# Patient Record
Sex: Female | Born: 1950
Health system: Southern US, Community
[De-identification: ages and names within clinical notes are randomized; demographics above are authoritative.]

## PROBLEM LIST (undated history)

## (undated) DIAGNOSIS — M654 Radial styloid tenosynovitis [de Quervain]: Secondary | ICD-10-CM

## (undated) DIAGNOSIS — E559 Vitamin D deficiency, unspecified: Secondary | ICD-10-CM

## (undated) DIAGNOSIS — H6120 Impacted cerumen, unspecified ear: Secondary | ICD-10-CM

## (undated) DIAGNOSIS — F32A Depression, unspecified: Secondary | ICD-10-CM

## (undated) DIAGNOSIS — E785 Hyperlipidemia, unspecified: Secondary | ICD-10-CM

## (undated) DIAGNOSIS — Z6829 Body mass index (BMI) 29.0-29.9, adult: Secondary | ICD-10-CM

## (undated) DIAGNOSIS — R198 Other specified symptoms and signs involving the digestive system and abdomen: Secondary | ICD-10-CM

## (undated) DIAGNOSIS — J309 Allergic rhinitis, unspecified: Secondary | ICD-10-CM

## (undated) DIAGNOSIS — Z9181 History of falling: Secondary | ICD-10-CM

## (undated) DIAGNOSIS — F419 Anxiety disorder, unspecified: Secondary | ICD-10-CM

## (undated) DIAGNOSIS — L989 Disorder of the skin and subcutaneous tissue, unspecified: Secondary | ICD-10-CM

## (undated) DIAGNOSIS — M8588 Other specified disorders of bone density and structure, other site: Secondary | ICD-10-CM

## (undated) DIAGNOSIS — I10 Essential (primary) hypertension: Secondary | ICD-10-CM

## (undated) DIAGNOSIS — R197 Diarrhea, unspecified: Secondary | ICD-10-CM

## (undated) DIAGNOSIS — N390 Urinary tract infection, site not specified: Secondary | ICD-10-CM

## (undated) DIAGNOSIS — M19019 Primary osteoarthritis, unspecified shoulder: Secondary | ICD-10-CM

## (undated) DIAGNOSIS — H68003 Unspecified Eustachian salpingitis, bilateral: Secondary | ICD-10-CM

## (undated) DIAGNOSIS — E7800 Pure hypercholesterolemia, unspecified: Secondary | ICD-10-CM

## (undated) DIAGNOSIS — R300 Dysuria: Secondary | ICD-10-CM

## (undated) DIAGNOSIS — N3281 Overactive bladder: Secondary | ICD-10-CM

## (undated) DIAGNOSIS — R739 Hyperglycemia, unspecified: Secondary | ICD-10-CM

## (undated) DIAGNOSIS — E119 Type 2 diabetes mellitus without complications: Secondary | ICD-10-CM

## (undated) DIAGNOSIS — F329 Major depressive disorder, single episode, unspecified: Secondary | ICD-10-CM

## (undated) DIAGNOSIS — J45909 Unspecified asthma, uncomplicated: Secondary | ICD-10-CM

## (undated) DIAGNOSIS — E663 Overweight: Secondary | ICD-10-CM

## (undated) DIAGNOSIS — Z780 Asymptomatic menopausal state: Secondary | ICD-10-CM

## (undated) DIAGNOSIS — Z87898 Personal history of other specified conditions: Secondary | ICD-10-CM

## (undated) DIAGNOSIS — H6123 Impacted cerumen, bilateral: Secondary | ICD-10-CM

## (undated) DIAGNOSIS — J40 Bronchitis, not specified as acute or chronic: Secondary | ICD-10-CM

## (undated) DIAGNOSIS — F410 Panic disorder [episodic paroxysmal anxiety] without agoraphobia: Secondary | ICD-10-CM

## (undated) DIAGNOSIS — E669 Obesity, unspecified: Secondary | ICD-10-CM

## (undated) DIAGNOSIS — F4321 Adjustment disorder with depressed mood: Secondary | ICD-10-CM

## (undated) DIAGNOSIS — F39 Unspecified mood [affective] disorder: Secondary | ICD-10-CM

## (undated) HISTORY — DX: Bronchitis, not specified as acute or chronic: J40

## (undated) HISTORY — DX: Impacted cerumen, unspecified ear: H61.20

## (undated) HISTORY — DX: Vitamin D deficiency, unspecified: E55.9

## (undated) HISTORY — DX: Body mass index (BMI) 29.0-29.9, adult: Z68.29

## (undated) HISTORY — DX: Overweight: E66.3

## (undated) HISTORY — DX: Hyperlipidemia, unspecified: E78.5

## (undated) HISTORY — DX: Adjustment disorder with depressed mood: F43.21

## (undated) HISTORY — DX: Personal history of other specified conditions: Z87.898

## (undated) HISTORY — DX: Primary osteoarthritis, unspecified shoulder: M19.019

## (undated) HISTORY — DX: Overactive bladder: N32.81

## (undated) HISTORY — DX: Allergic rhinitis, unspecified: J30.9

## (undated) HISTORY — DX: Dysuria: R30.0

## (undated) HISTORY — DX: History of falling: Z91.81

## (undated) HISTORY — DX: Unspecified eustachian salpingitis, bilateral: H68.003

## (undated) HISTORY — DX: Anxiety disorder, unspecified: F41.9

## (undated) HISTORY — DX: Other specified symptoms and signs involving the digestive system and abdomen: R19.8

## (undated) HISTORY — DX: Depression, unspecified: F32.A

## (undated) HISTORY — DX: Disorder of the skin and subcutaneous tissue, unspecified: L98.9

## (undated) HISTORY — DX: Radial styloid tenosynovitis (de quervain): M65.4

## (undated) HISTORY — DX: Urinary tract infection, site not specified: N39.0

## (undated) HISTORY — DX: Other specified disorders of bone density and structure, other site: M85.88

## (undated) HISTORY — DX: Unspecified asthma, uncomplicated: J45.909

## (undated) HISTORY — DX: Obesity, unspecified: E66.9

## (undated) HISTORY — DX: Type 2 diabetes mellitus without complications: E11.9

## (undated) HISTORY — DX: Asymptomatic menopausal state: Z78.0

## (undated) HISTORY — DX: Impacted cerumen, bilateral: H61.23

## (undated) HISTORY — DX: Unspecified mood (affective) disorder: F39

## (undated) HISTORY — DX: Diarrhea, unspecified: R19.7

## (undated) HISTORY — DX: Hyperglycemia, unspecified: R73.9

---

## 1981-09-26 HISTORY — PX: TUBAL LIGATION: SHX77

## 1997-09-26 HISTORY — PX: TONSILLECTOMY: SUR1361

## 2004-09-26 HISTORY — PX: BREAST BIOPSY: SHX20

## 2014-12-16 ENCOUNTER — Emergency Department (HOSPITAL_BASED_OUTPATIENT_CLINIC_OR_DEPARTMENT_OTHER)
Admission: EM | Admit: 2014-12-16 | Discharge: 2014-12-16 | Disposition: A | Discharge status: Home / Self Care | Payer: No Typology Code available for payment source | Attending: Emergency Medicine | Admitting: Emergency Medicine

## 2014-12-16 ENCOUNTER — Encounter (HOSPITAL_BASED_OUTPATIENT_CLINIC_OR_DEPARTMENT_OTHER): Payer: Self-pay | Admitting: Emergency Medicine

## 2014-12-16 DIAGNOSIS — R197 Diarrhea, unspecified: Secondary | ICD-10-CM | POA: Diagnosis not present

## 2014-12-16 DIAGNOSIS — R1110 Vomiting, unspecified: Secondary | ICD-10-CM

## 2014-12-16 DIAGNOSIS — R109 Unspecified abdominal pain: Secondary | ICD-10-CM | POA: Diagnosis present

## 2014-12-16 DIAGNOSIS — R112 Nausea with vomiting, unspecified: Secondary | ICD-10-CM | POA: Insufficient documentation

## 2014-12-16 DIAGNOSIS — H9209 Otalgia, unspecified ear: Secondary | ICD-10-CM | POA: Insufficient documentation

## 2014-12-16 DIAGNOSIS — I10 Essential (primary) hypertension: Secondary | ICD-10-CM | POA: Diagnosis not present

## 2014-12-16 DIAGNOSIS — R05 Cough: Secondary | ICD-10-CM | POA: Insufficient documentation

## 2014-12-16 DIAGNOSIS — Z880 Allergy status to penicillin: Secondary | ICD-10-CM | POA: Diagnosis not present

## 2014-12-16 DIAGNOSIS — N3090 Cystitis, unspecified without hematuria: Secondary | ICD-10-CM | POA: Diagnosis not present

## 2014-12-16 DIAGNOSIS — R000 Tachycardia, unspecified: Secondary | ICD-10-CM | POA: Insufficient documentation

## 2014-12-16 DIAGNOSIS — R51 Headache: Secondary | ICD-10-CM | POA: Insufficient documentation

## 2014-12-16 DIAGNOSIS — Z79899 Other long term (current) drug therapy: Secondary | ICD-10-CM | POA: Insufficient documentation

## 2014-12-16 DIAGNOSIS — Z8639 Personal history of other endocrine, nutritional and metabolic disease: Secondary | ICD-10-CM | POA: Diagnosis not present

## 2014-12-16 DIAGNOSIS — F419 Anxiety disorder, unspecified: Secondary | ICD-10-CM | POA: Insufficient documentation

## 2014-12-16 HISTORY — DX: Essential (primary) hypertension: I10

## 2014-12-16 HISTORY — DX: Pure hypercholesterolemia, unspecified: E78.00

## 2014-12-16 HISTORY — DX: Panic disorder (episodic paroxysmal anxiety): F41.0

## 2014-12-16 HISTORY — DX: Anxiety disorder, unspecified: F41.9

## 2014-12-16 LAB — URINALYSIS, ROUTINE W REFLEX MICROSCOPIC
Bilirubin Urine: NEGATIVE
Glucose, UA: NEGATIVE mg/dL
Hgb urine dipstick: NEGATIVE
KETONES UR: NEGATIVE mg/dL
Nitrite: NEGATIVE
PH: 6 (ref 5.0–8.0)
Protein, ur: NEGATIVE mg/dL
Specific Gravity, Urine: 1.015 (ref 1.005–1.030)
UROBILINOGEN UA: 0.2 mg/dL (ref 0.0–1.0)

## 2014-12-16 LAB — CBC WITH DIFFERENTIAL/PLATELET
BASOS ABS: 0 10*3/uL (ref 0.0–0.1)
BASOS PCT: 0 % (ref 0–1)
Eosinophils Absolute: 0.1 10*3/uL (ref 0.0–0.7)
Eosinophils Relative: 1 % (ref 0–5)
HEMATOCRIT: 36.8 % (ref 36.0–46.0)
Hemoglobin: 11.9 g/dL — ABNORMAL LOW (ref 12.0–15.0)
LYMPHS ABS: 0.6 10*3/uL — AB (ref 0.7–4.0)
LYMPHS PCT: 6 % — AB (ref 12–46)
MCH: 26.2 pg (ref 26.0–34.0)
MCHC: 32.3 g/dL (ref 30.0–36.0)
MCV: 81.1 fL (ref 78.0–100.0)
MONO ABS: 0.9 10*3/uL (ref 0.1–1.0)
Monocytes Relative: 9 % (ref 3–12)
NEUTROS PCT: 84 % — AB (ref 43–77)
Neutro Abs: 8.3 10*3/uL — ABNORMAL HIGH (ref 1.7–7.7)
Platelets: 249 10*3/uL (ref 150–400)
RBC: 4.54 MIL/uL (ref 3.87–5.11)
RDW: 13.8 % (ref 11.5–15.5)
WBC: 9.9 10*3/uL (ref 4.0–10.5)

## 2014-12-16 LAB — URINE MICROSCOPIC-ADD ON

## 2014-12-16 LAB — COMPREHENSIVE METABOLIC PANEL
ALK PHOS: 42 U/L (ref 39–117)
ALT: 21 U/L (ref 0–35)
AST: 25 U/L (ref 0–37)
Albumin: 4 g/dL (ref 3.5–5.2)
Anion gap: 8 (ref 5–15)
BUN: 11 mg/dL (ref 6–23)
CALCIUM: 9.2 mg/dL (ref 8.4–10.5)
CO2: 24 mmol/L (ref 19–32)
Chloride: 105 mmol/L (ref 96–112)
Creatinine, Ser: 0.9 mg/dL (ref 0.50–1.10)
GFR, EST AFRICAN AMERICAN: 78 mL/min — AB (ref >90)
GFR, EST NON AFRICAN AMERICAN: 67 mL/min — AB (ref >90)
GLUCOSE: 128 mg/dL — AB (ref 70–99)
Potassium: 3.7 mmol/L (ref 3.5–5.1)
Sodium: 137 mmol/L (ref 135–145)
Total Bilirubin: 0.4 mg/dL (ref 0.3–1.2)
Total Protein: 7.7 g/dL (ref 6.0–8.3)

## 2014-12-16 MED ORDER — ACETAMINOPHEN 325 MG PO TABS
650.0000 mg | ORAL_TABLET | Freq: Once | ORAL | Status: AC
  Administered 2014-12-16: 650 mg via ORAL
  Filled 2014-12-16 (×2): qty 2

## 2014-12-16 MED ORDER — ONDANSETRON HCL 4 MG/2ML IJ SOLN
4.0000 mg | Freq: Once | INTRAMUSCULAR | Status: AC
  Administered 2014-12-16: 4 mg via INTRAVENOUS

## 2014-12-16 MED ORDER — IBUPROFEN 400 MG PO TABS
600.0000 mg | ORAL_TABLET | Freq: Once | ORAL | Status: AC
  Administered 2014-12-16: 600 mg via ORAL
  Filled 2014-12-16 (×2): qty 1

## 2014-12-16 MED ORDER — SODIUM CHLORIDE 0.9 % IV BOLUS (SEPSIS)
1000.0000 mL | Freq: Once | INTRAVENOUS | Status: AC
  Administered 2014-12-16: 1000 mL via INTRAVENOUS

## 2014-12-16 MED ORDER — ONDANSETRON HCL 4 MG/2ML IJ SOLN
INTRAMUSCULAR | Status: AC
  Administered 2014-12-16: 4 mg via INTRAVENOUS
  Filled 2014-12-16: qty 2

## 2014-12-16 MED ORDER — CEFPODOXIME PROXETIL 100 MG PO TABS: 100.0000 mg | ORAL_TABLET | Freq: Two times a day (BID) | ORAL | Status: DC

## 2014-12-16 MED ORDER — ONDANSETRON 4 MG PO TBDP: ORAL_TABLET | ORAL | Status: DC

## 2014-12-16 NOTE — ED Notes (Signed)
HA, cough, back pain, body aches, vomiting, diarrhea.  Grandchild diagnosed with flu last week and pt has been taking care of her.

## 2014-12-16 NOTE — Discharge Instructions (Signed)

## 2014-12-16 NOTE — ED Provider Notes (Signed)
CSN: [id number]     Arrival date & time 12/16/14  2021 History  This chart was scribed for Mirian Mo, MD by Elon Spanner, ED Scribe. This patient was seen in room MH05/MH05 and the patient's care was started at 8:57 PM.   Chief Complaint  Patient presents with  . Flu-like symptoms    The history is provided by the patient. No language interpreter was used.   HPI Comments: Heather Woods is a 64 y.o. female who presents to the Emergency Department complaining of vomiting with associated diarrhea, transient abdominal cramping, headache, dry cough, ear pain onset last night.  Patient reports her granddaughter was diagnosed with the flu last week and the patient has been in close contact.  Patient reports a history of HTN, HLD, GAD. No associated measured fevers. Patient denies history of respiratory conditions. Symptoms intermittent, no exacerbating or alleviating factors.   Past Medical History  Diagnosis Date  . High cholesterol   . Hypertension   . Anxiety   . Panic attack    History reviewed. No pertinent past surgical history. No family history on file. History  Substance Use Topics  . Smoking status: Never Smoker   . Smokeless tobacco: Not on file  . Alcohol Use: No   OB History    No data available     Review of Systems  HENT: Positive for ear pain.   Respiratory: Positive for cough.   Gastrointestinal: Positive for nausea, vomiting, abdominal pain and diarrhea.  Neurological: Positive for headaches.  All other systems reviewed and are negative.     Allergies  Penicillins  Home Medications   Prior to Admission medications   Medication Sig Start Date End Date Taking? Authorizing Provider  lisinopril (PRINIVIL,ZESTRIL) 2.5 MG tablet Take 2.5 mg by mouth daily.   Yes Historical Provider, MD  sertraline (ZOLOFT) 50 MG tablet Take 50 mg by mouth daily.   Yes Historical Provider, MD  cefpodoxime (VANTIN) 100 MG tablet Take 1 tablet (100 mg total) by mouth 2 (two)  times daily. 12/16/14   Mirian Mo, MD  ondansetron (ZOFRAN ODT) 4 MG disintegrating tablet  ODT q4 hours prn nausea/vomit 12/16/14   Mirian Mo, MD   BP 126/71 mmHg  Pulse 102  Temp(Src) 100.8 F (38.2 C) (Oral)  Resp 20  Ht  (1.626 m)  Wt 160 lb (72.576 kg)  BMI 27.45 kg/m2  SpO2 93% Physical Exam  Constitutional: She is oriented to person, place, and time. She appears well-developed and well-nourished.  HENT:  Head: Normocephalic and atraumatic.  Right Ear: External ear normal.  Left Ear: External ear normal.  Eyes: Conjunctivae and EOM are normal. Pupils are equal, round, and reactive to light.  Neck: Normal range of motion. Neck supple.  Cardiovascular: Regular rhythm, normal heart sounds and intact distal pulses.  Tachycardia present.   Pulmonary/Chest: Effort normal and breath sounds normal.  Abdominal: Soft. Bowel sounds are normal. There is no tenderness.  Musculoskeletal: Normal range of motion.  Neurological: She is alert and oriented to person, place, and time.  Skin: Skin is warm and dry.  Vitals reviewed.   ED Course  Procedures (including critical care time)  DIAGNOSTIC STUDIES: Oxygen Saturation is 96% on RA, adequate by my interpretation.    COORDINATION OF CARE:  9:02 PM Discussed treatment plan with patient at bedside.  Patient acknowledges and agrees with plan.    Labs Review Labs Reviewed  CBC WITH DIFFERENTIAL/PLATELET - Abnormal; Notable for the following:  Hemoglobin 11.9 (*)    Neutrophils Relative % 84 (*)    Neutro Abs 8.3 (*)    Lymphocytes Relative 6 (*)    Lymphs Abs 0.6 (*)    All other components within normal limits  COMPREHENSIVE METABOLIC PANEL - Abnormal; Notable for the following:    Glucose, Bld 128 (*)    GFR calc non Af Amer 67 (*)    GFR calc Af Amer 78 (*)    All other components within normal limits  URINALYSIS, ROUTINE W REFLEX MICROSCOPIC - Abnormal; Notable for the following:    Leukocytes, UA MODERATE  (*)    All other components within normal limits  URINE MICROSCOPIC-ADD ON    Imaging Review No results found.   EKG Interpretation None      MDM   Final diagnoses:  Vomiting and diarrhea  Cystitis    64 y.o. female with pertinent PMH of HTN, anxiety presents with signs and symptoms as above consistent with gastritis. Patient has known sick contacts. On arrival today vitals signs and physical exam as above. Likely gastroenteritis. Workup unremarkable. Symptoms improved after normal saline bolus and Zofran. Urinalysis with questionable UTI, will treat empirically with Macrobid. Discharged home in stable condition. Standard return precautions given. Offered Tamiflu, however we shared decision-making agreed to defer at this time as the patient has no risk factors for increased mortality..    I have reviewed all laboratory and imaging studies if ordered as above  1. Vomiting and diarrhea   2. Cystitis           Mirian MoMatthew Peggie Hornak, MD 12/16/14 [phone]

## 2015-08-23 ENCOUNTER — Emergency Department (HOSPITAL_BASED_OUTPATIENT_CLINIC_OR_DEPARTMENT_OTHER)
Admission: EM | Admit: 2015-08-23 | Discharge: 2015-08-24 | Disposition: A | Discharge status: Home / Self Care | Payer: 59 | Attending: Emergency Medicine | Admitting: Emergency Medicine

## 2015-08-23 ENCOUNTER — Encounter (HOSPITAL_BASED_OUTPATIENT_CLINIC_OR_DEPARTMENT_OTHER): Payer: Self-pay | Admitting: *Deleted

## 2015-08-23 DIAGNOSIS — I10 Essential (primary) hypertension: Secondary | ICD-10-CM | POA: Diagnosis not present

## 2015-08-23 DIAGNOSIS — Z79899 Other long term (current) drug therapy: Secondary | ICD-10-CM | POA: Diagnosis not present

## 2015-08-23 DIAGNOSIS — Z880 Allergy status to penicillin: Secondary | ICD-10-CM | POA: Diagnosis not present

## 2015-08-23 DIAGNOSIS — Z8639 Personal history of other endocrine, nutritional and metabolic disease: Secondary | ICD-10-CM | POA: Insufficient documentation

## 2015-08-23 DIAGNOSIS — K0889 Other specified disorders of teeth and supporting structures: Secondary | ICD-10-CM | POA: Diagnosis not present

## 2015-08-23 DIAGNOSIS — K08409 Partial loss of teeth, unspecified cause, unspecified class: Secondary | ICD-10-CM | POA: Diagnosis not present

## 2015-08-23 DIAGNOSIS — F410 Panic disorder [episodic paroxysmal anxiety] without agoraphobia: Secondary | ICD-10-CM | POA: Insufficient documentation

## 2015-08-23 NOTE — ED Notes (Signed)
C/o L side upper and lower toothache, on & off for ~ 1 week, [place] [facility] [place], took 2 tylenol PTA, denies other sx. Mentions "in process of getting root canal". Not taking abx at this time.

## 2015-08-24 MED ORDER — CLINDAMYCIN HCL 150 MG PO CAPS
300.0000 mg | ORAL_CAPSULE | Freq: Once | ORAL | Status: AC
  Administered 2015-08-24: 300 mg via ORAL
  Filled 2015-08-24: qty 2

## 2015-08-24 MED ORDER — CLINDAMYCIN HCL 150 MG PO CAPS: 300.0000 mg | ORAL_CAPSULE | Freq: Three times a day (TID) | ORAL | Status: DC

## 2015-08-24 MED ORDER — BUPIVACAINE-EPINEPHRINE (PF) 0.5% -1:200000 IJ SOLN
1.8000 mL | Freq: Once | INTRAMUSCULAR | Status: AC
  Administered 2015-08-24: 1.8 mL
  Filled 2015-08-24: qty 1.8

## 2015-08-24 MED ORDER — HYDROCODONE-ACETAMINOPHEN 5-325 MG PO TABS: 1.0000 | ORAL_TABLET | Freq: Four times a day (QID) | ORAL | Status: DC | PRN

## 2015-08-24 NOTE — ED Provider Notes (Signed)
CSN: [id number]     Arrival date & time 08/23/15  2155 History  By signing my name below, I, Jarvis Morganaylor Ferguson, attest that this documentation has been prepared under the direction and in the presence of Paula LibraJohn Audon Heymann, MD. Electronically Signed: Jarvis Morganaylor Ferguson, ED Scribe. 08/24/2015. 12:15 AM.    Chief Complaint  Patient presents with  . Dental Pain   The history is provided by the patient. No language interpreter was used.    HPI Comments: Heather DuelLecia Maxton is a 64 y.o. female with no PMHx who presents to the Emergency Department complaining of dental pain for the past week. The pain is associated with carious left upper molar. She is in the process of having a root canal done as a follow-up appointment on December 1. She is here with worsening pain. The pain is intermittent and worse with eating or drinking. She rates it as a 5 out of 10 at the present time. There is associated swelling of the left maxillary region. She is not aware of having a fever. She has taken Tylenol without adequate relief.   Past Medical History  Diagnosis Date  . High cholesterol   . Hypertension   . Anxiety   . Panic attack    History reviewed. No pertinent past surgical history. History reviewed. No pertinent family history. Social History  Substance Use Topics  . Smoking status: Never Smoker   . Smokeless tobacco: None  . Alcohol Use: No   OB History    No data available     Review of Systems A complete 10 system review of systems was obtained and all systems are negative except as noted in the HPI and PMH.     Allergies  Penicillins  Home Medications   Prior to Admission medications   Medication Sig Start Date End Date Taking? Authorizing Provider  cefpodoxime (VANTIN) 100 MG tablet Take 1 tablet (100 mg total) by mouth 2 (two) times daily. 12/16/14   Mirian MoMatthew Gentry, MD  lisinopril (PRINIVIL,ZESTRIL) 2.5 MG tablet Take 2.5 mg by mouth daily.    Historical Provider, MD  ondansetron (ZOFRAN ODT)  4 MG disintegrating tablet 4mg  ODT q4 hours prn nausea/vomit 12/16/14   Mirian MoMatthew Gentry, MD  sertraline (ZOLOFT) 50 MG tablet Take 50 mg by mouth daily.    Historical Provider, MD   Triage Vitals: BP 160/89 mmHg  Pulse 63  Temp(Src) 98.6 F (37 C) (Oral)  Resp 20  Ht 5\' 3"  (1.6 m)  Wt 160 lb (72.576 kg)  BMI 28.35 kg/m2  SpO2 99%  Physical Exam  Nursing note and vitals reviewed.  General: Well-developed, well-nourished female in no acute distress; appearance consistent with age of record HENT: normocephalic; atraumatic; multiple missing teeth; carious left upper molar without tenderness to percussion with soft tissue swelling and tenderness of left maxillary region without erythema or warmth Eyes: pupils equal, round and reactive to light; extraocular muscles intact Neck: supple; no lymphadenopathy Heart: regular rate and rhythm Lungs: clear to auscultation bilaterally Abdomen: soft; nondistended Extremities: No deformity; full range of motion Neurologic: Awake, alert and oriented; motor function intact in all extremities and symmetric; no facial droop Skin: Warm and dry Psychiatric: Normal mood and affect   ED Course  Procedures (including critical care time)  DENTAL BLOCK 1.8 milliliters of 0.5% bupivacaine with epinephrine were injected into the buccal fold adjacent to the carious molar mentioned above. The patient tolerated this well and there were no immediate complications.  She was advised to contact her dentist  later today and advise her of her worsening symptoms.  MDM  I personally performed the services described in this documentation, which was scribed in my presence. The recorded information has been reviewed and is accurate.     Paula Libra, MD 08/24/15 [phone]

## 2016-02-26 ENCOUNTER — Emergency Department (HOSPITAL_BASED_OUTPATIENT_CLINIC_OR_DEPARTMENT_OTHER)
Admission: EM | Admit: 2016-02-26 | Discharge: 2016-02-26 | Disposition: A | Discharge status: Home / Self Care | Payer: BLUE CROSS/BLUE SHIELD | Attending: Emergency Medicine | Admitting: Emergency Medicine

## 2016-02-26 ENCOUNTER — Encounter (HOSPITAL_BASED_OUTPATIENT_CLINIC_OR_DEPARTMENT_OTHER): Payer: Self-pay | Admitting: *Deleted

## 2016-02-26 DIAGNOSIS — N390 Urinary tract infection, site not specified: Secondary | ICD-10-CM

## 2016-02-26 DIAGNOSIS — E7800 Pure hypercholesterolemia, unspecified: Secondary | ICD-10-CM | POA: Diagnosis not present

## 2016-02-26 DIAGNOSIS — Z79899 Other long term (current) drug therapy: Secondary | ICD-10-CM | POA: Diagnosis not present

## 2016-02-26 DIAGNOSIS — I10 Essential (primary) hypertension: Secondary | ICD-10-CM | POA: Diagnosis not present

## 2016-02-26 DIAGNOSIS — R350 Frequency of micturition: Secondary | ICD-10-CM | POA: Diagnosis present

## 2016-02-26 LAB — URINALYSIS, ROUTINE W REFLEX MICROSCOPIC
BILIRUBIN URINE: NEGATIVE
Glucose, UA: NEGATIVE mg/dL
Ketones, ur: NEGATIVE mg/dL
Nitrite: POSITIVE — AB
PH: 6 (ref 5.0–8.0)
Protein, ur: 30 mg/dL — AB
SPECIFIC GRAVITY, URINE: 1.016 (ref 1.005–1.030)

## 2016-02-26 LAB — URINE MICROSCOPIC-ADD ON

## 2016-02-26 MED ORDER — NITROFURANTOIN MONOHYD MACRO 100 MG PO CAPS: 100.0000 mg | ORAL_CAPSULE | Freq: Two times a day (BID) | ORAL | Status: DC

## 2016-02-26 MED ORDER — NITROFURANTOIN MONOHYD MACRO 100 MG PO CAPS
100.0000 mg | ORAL_CAPSULE | Freq: Once | ORAL | Status: AC
  Administered 2016-02-26: 100 mg via ORAL
  Filled 2016-02-26: qty 1

## 2016-02-26 MED FILL — NITROFURANTOIN MONO-MCR 100: 100 | 5 days supply | Qty: 10 | Fill #0

## 2016-02-26 NOTE — Discharge Instructions (Signed)
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
## 2016-02-26 NOTE — ED Notes (Signed)
Urinary frequency. Back pain.

## 2016-03-18 ENCOUNTER — Ambulatory Visit: Payer: BLUE CROSS/BLUE SHIELD | Admitting: Physical Therapy

## 2016-03-21 ENCOUNTER — Ambulatory Visit: Payer: No Typology Code available for payment source | Attending: Chiropractic Medicine | Admitting: Physical Therapy

## 2016-03-21 ENCOUNTER — Encounter: Payer: Self-pay | Admitting: Physical Therapy

## 2016-03-21 DIAGNOSIS — M545 Low back pain, unspecified: Secondary | ICD-10-CM

## 2016-03-21 DIAGNOSIS — R252 Cramp and spasm: Secondary | ICD-10-CM
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
## 2016-03-23 ENCOUNTER — Encounter: Payer: Self-pay | Admitting: Physical Therapy

## 2016-03-23 ENCOUNTER — Ambulatory Visit: Payer: No Typology Code available for payment source | Admitting: Physical Therapy

## 2016-03-23 DIAGNOSIS — M545 Low back pain, unspecified: Secondary | ICD-10-CM

## 2016-03-23 DIAGNOSIS — R252 Cramp and spasm: Secondary | ICD-10-CM

## 2016-03-23 NOTE — Therapy (Addendum)
[place], [place], [address] Phone: [phone]   Fax:  [phone]  Physical Therapy Treatment  Patient Details  Name: Lavera Vandermeer MRN: [id number] Date of Birth: 01-Nov-1950 Referring Provider: Berta Minor  Encounter Date: 03/23/2016      PT End of Session - 03/23/16 0950    Visit Number 2   Date for PT Re-Evaluation 05/21/16   PT Start Time 0909   PT Stop Time 1006   PT Time Calculation (min) 57 min   Activity Tolerance Patient tolerated treatment well   Behavior During Therapy [facility] for tasks assessed/performed      Past Medical History  Diagnosis Date  . High cholesterol   . Hypertension   . Anxiety   . Panic attack     History reviewed. No pertinent past surgical history.  There were no vitals filed for this visit.      Subjective Assessment - 03/23/16 0910    Subjective Patient reports no issues with the HEP, reports that she wants to lknow what to do to be able to go back to the gym   Currently in Pain? Yes   Pain Score 1    Pain Location Back   Pain Orientation Left;Lower                         [facility] Adult PT Treatment/Exercise - 03/23/16 0001    Self-Care   Self-Care ADL's;Lifting   ADL's vaccuuming, cleaning, foot prop   Lifting from floor   Exercises   Exercises Lumbar   Lumbar Exercises: Stretches   Passive Hamstring Stretch 3 reps;30 seconds   Piriformis Stretch 3 reps;30 seconds   Lumbar Exercises: Aerobic   Elliptical R=5, I=10 x 5 minutes   Lumbar Exercises: Machines for Strengthening   Cybex Knee Extension 10# 2x15   Cybex Knee Flexion 25# 2x15   Other Lumbar Machine Exercise Seated row 20#, lats 20# 2x15   Lumbar Exercises: Supine   Bridge 20 reps   Bridge Limitations with ball squeeze   Large Ball Oblique Isometric 2 seconds;20 reps   Other Supine Lumbar Exercises feet on ball K2C, trunk rotation, and bridges   Lumbar Exercises:  Prone   Other Prone Lumbar Exercises modified and regular planks   Moist Heat Therapy   Number Minutes Moist Heat 15 Minutes   Moist Heat Location Lumbar Spine   Electrical Stimulation   Electrical Stimulation Location lumbar spine   Electrical Stimulation Action IFC   Electrical Stimulation Parameters supine   Electrical Stimulation Goals Pain                  PT Short Term Goals - 03/23/16 4536    PT SHORT TERM GOAL #1   Title independent with initial HEP   Status Achieved           PT Long Term Goals - 03/23/16 4680    PT LONG TERM GOAL #1   Title independent and safe with return to gym setting   Status Partially Met   PT LONG TERM GOAL #2   Title understand proper posture and body mechanics   Status Achieved   PT LONG TERM GOAL #3   Title decrease pain 50%   Status Partially Met   PT LONG TERM GOAL #4   Title increased lumbar ROM 25%   Status Partially Met  Plan - 03/23/16 0950    Clinical Impression Statement Patient tolerated gym activities wihtout increase of pain, asks good questions about her returning to the gym on her own.  She reports that she feels good about going back safely as well as understanding body mechanics   PT Next Visit Plan Will place on hold x 2 weeks as she tries to return to the gym   Consulted and Agree with Plan of Care Patient      Patient will benefit from skilled therapeutic intervention in order to improve the following deficits and impairments:  Decreased activity tolerance, Decreased strength, Decreased range of motion, Impaired flexibility, Increased muscle spasms, Pain, Improper body mechanics, Postural dysfunction  Visit Diagnosis: Bilateral low back pain without sciatica  Cramp and spasm    PHYSICAL THERAPY DISCHARGE SUMMARY   Plan: Patient agrees to discharge.  Patient goals were partially met. Patient is being discharged due to meeting the stated rehab goals.  ?????         Problem List There are no active problems to display for this patient.   Sumner Boast., PT 03/23/2016, 9:53 AM  [place], [place], [address] Phone: [phone]   Fax:  [phone]  Name: Denika Krone MRN: [id number] Date of Birth: 03-31-1951

## 2016-03-28 ENCOUNTER — Ambulatory Visit: Payer: No Typology Code available for payment source | Attending: Chiropractic Medicine | Admitting: Physical Therapy

## 2016-10-04 ENCOUNTER — Encounter (HOSPITAL_COMMUNITY): Payer: Self-pay | Admitting: *Deleted

## 2016-10-04 DIAGNOSIS — I10 Essential (primary) hypertension: Secondary | ICD-10-CM | POA: Insufficient documentation

## 2016-10-04 DIAGNOSIS — M25532 Pain in left wrist: Secondary | ICD-10-CM | POA: Insufficient documentation

## 2016-10-04 DIAGNOSIS — Z79899 Other long term (current) drug therapy: Secondary | ICD-10-CM | POA: Insufficient documentation

## 2016-10-04 NOTE — ED Triage Notes (Signed)
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

## 2016-10-04 NOTE — ED Triage Notes (Signed)
The pt is here with her daughter that has checked in to be seen also

## 2016-10-05 ENCOUNTER — Emergency Department (HOSPITAL_COMMUNITY)
Admission: EM | Admit: 2016-10-05 | Discharge: 2016-10-05 | Disposition: A | Discharge status: Home / Self Care | Payer: BLUE CROSS/BLUE SHIELD | Attending: Emergency Medicine | Admitting: Emergency Medicine

## 2016-10-05 ENCOUNTER — Encounter (HOSPITAL_COMMUNITY): Payer: Self-pay | Admitting: Physician Assistant

## 2016-10-05 DIAGNOSIS — M25532 Pain in left wrist: Secondary | ICD-10-CM

## 2016-10-05 MED ORDER — ACETAMINOPHEN 325 MG PO TABS
650.0000 mg | ORAL_TABLET | Freq: Once | ORAL | Status: AC
  Administered 2016-10-05: 650 mg via ORAL
  Filled 2016-10-05: qty 2

## 2016-10-05 NOTE — ED Provider Notes (Signed)
[facility]-EMER[facility]ENCY DEPT Provider Note   CSN: [id number] Arrival date & time: 10/04/16  2203     History   Chief Complaint Chief Complaint  Patient presents with  . Hand Pain    HPI Heather Woods is a 66 y.o. female  Patient is a 66 y.o female with history of bilateral carpal tunnel and surgically treated presents with left wrist pain 2 days. Patient states that her pain is has been worsening, worse in the morning, intermittent, and 4/10 severity of pain. Patient reports that she has not tried anything for her pain. She reports movement of hand, specifically flexion and extension of wrist aggravates symptoms. Patient reports associated swelling to the area, but no redness to the area. Patient states that her symptoms are not similar to her carpal tunnel she had several years ago. She states that her pain is on her left wrist and can cause pain throughout hand during this time. Patient denies any numbness or tingling. Patient reports being able to move hand with limited range of motion secondary to pain. She reports full sensation to the area. Patient denies any history of osteoarthritis. Patient denies any trauma, falls, fevers, chills, nausea, vomiting, chest pain, shortness of breath.    The history is provided by the patient. No language interpreter was used.  Hand Pain  Pertinent negatives include no chest pain and no shortness of breath.    Past Medical History:  Diagnosis Date  . Anxiety   . High cholesterol   . Hypertension   . Panic attack     There are no active problems to display for this patient.   History reviewed. No pertinent surgical history.  OB History    No data available       Home Medications    Prior to Admission medications   Medication Sig Start Date End Date Taking? Authorizing Provider  cefpodoxime (VANTIN) 100 MG tablet Take 1 tablet (100 mg total) by mouth 2 (two) times daily. 12/16/14   Mirian Mo, MD  clindamycin (CLEOCIN) 150 MG capsule  Take 2 capsules (300 mg total) by mouth 3 (three) times daily. 08/24/15   John Molpus, MD  HYDROcodone-acetaminophen (NORCO/VICODIN) 5-325 MG tablet Take 1-2 tablets by mouth every 6 (six) hours as needed (for pain). 08/24/15   John Molpus, MD  lisinopril (PRINIVIL,ZESTRIL) 2.5 MG tablet Take 2.5 mg by mouth daily.    Historical Provider, MD  LOSARTAN POTASSIUM-HCTZ PO Take by mouth.    Historical Provider, MD  nitrofurantoin, macrocrystal-monohydrate, (MACROBID) 100 MG capsule Take 1 capsule (100 mg total) by mouth 2 (two) times daily. 02/26/16   Eber Hong, MD  ondansetron (ZOFRAN ODT) 4 MG disintegrating tablet 4mg  ODT q4 hours prn nausea/vomit 12/16/14   Mirian Mo, MD  sertraline (ZOLOFT) 50 MG tablet Take 50 mg by mouth daily.    Historical Provider, MD    Family History No family history on file.  Social History Social History  Substance Use Topics  . Smoking status: Never Smoker  . Smokeless tobacco: Never Used  . Alcohol use No     Allergies   Penicillins   Review of Systems Review of Systems  Constitutional: Negative for chills and fever.  Respiratory: Negative for shortness of breath.   Cardiovascular: Negative for chest pain.  Gastrointestinal: Negative for nausea and vomiting.  Musculoskeletal: Positive for arthralgias and joint swelling. Negative for neck pain and neck stiffness.  Skin: Negative for color change, rash and wound.  Neurological: Negative for numbness.  Physical Exam Updated Vital Signs BP 136/88 (BP Location: Right Arm)   Pulse 65   Temp 98 F (36.7 C) (Oral)   Resp 18   Ht 5\' 3"  (1.6 m)   Wt 77.6 kg   SpO2 98%   BMI 30.30 kg/m   Physical Exam  Constitutional: She is oriented to person, place, and time. She appears well-developed and well-nourished.  HENT:  Head: Normocephalic and atraumatic.  Nose: Nose normal.  Eyes: EOM are normal.  Neck: Normal range of motion.  Cardiovascular: Regular rhythm and intact distal pulses.     Distal pulses intact. Negative Allen's test.  Pulmonary/Chest: Effort normal.  Musculoskeletal: Normal range of motion.  Patient has mildly limited range of motion to left wrist secondary to pain. No redness to the area. No deformity noted. Tender to palpation at the base of fifth metacarpal joint on left hand.  Neurological: She is alert and oriented to person, place, and time.  Sensation intact left hand. Muscle strength 5/5 left hand. Negative Tinel's sign.  Skin: Skin is warm. No rash noted. No erythema.  Psychiatric: She has a normal mood and affect. Her behavior is normal.     ED Treatments / Results  Labs (all labs ordered are listed, but only abnormal results are displayed) Labs Reviewed - No data to display  EKG  EKG Interpretation None       Radiology No results found.  Procedures Procedures (including critical care time)  Medications Ordered in ED Medications  acetaminophen (TYLENOL) tablet 650 mg (650 mg Oral Given 10/05/16 0215)     Initial Impression / Assessment and Plan / ED Course  I have reviewed the triage vital signs and the nursing notes.  Pertinent labs & imaging results that were available during my care of the patient were reviewed by me and considered in my medical decision making (see chart for details).  Clinical Course   Patient denied any trauma or falls. No redness noted to the area, slight swelling to the area, mild limited range of motion secondary to pain, sensation intact, strength 5/5. At this point I do not believe an x-ray is necessary due to history and presentation. No numbness or tingling sensation. She has already been treated for carpal tunnel bilaterally surgically. Pain managed in ED. Pt advised to follow up with primary care physician if symptoms persist. Likely case of osteoarthritis the joint, however she does not have a history of osteoarthritis at this time. Conservative therapy recommended and discussed. Patient in NAD, VSS,  afebrile. Patient will be dc home & is agreeable with above plan. Return precautions given.  Final Clinical Impressions(s) / ED Diagnoses   Final diagnoses:  Left wrist pain    New Prescriptions Discharge Medication List as of 10/05/2016  2:17 AM       [address], [place] 10/05/16 0249    Arby BarretteMarcy Pfeiffer, MD 10/06/16 1235

## 2016-10-05 NOTE — Discharge Instructions (Signed)
Please continue taking Tylenol every 6 hours as needed for pain. Please use ice the area for 15 minutes 3-5 times a day. Please follow up with her primary care physician as needed.  Get help right away if: You lose feeling in your fingers or hand. Your fingers turn white, very red, or cold and blue. You cannot move your fingers. You have a fever or chills.

## 2016-12-12 ENCOUNTER — Emergency Department (HOSPITAL_BASED_OUTPATIENT_CLINIC_OR_DEPARTMENT_OTHER): Payer: BLUE CROSS/BLUE SHIELD

## 2016-12-12 ENCOUNTER — Emergency Department (HOSPITAL_BASED_OUTPATIENT_CLINIC_OR_DEPARTMENT_OTHER)
Admission: EM | Admit: 2016-12-12 | Discharge: 2016-12-12 | Disposition: A | Discharge status: Home / Self Care | Payer: BLUE CROSS/BLUE SHIELD | Attending: Emergency Medicine | Admitting: Emergency Medicine

## 2016-12-12 ENCOUNTER — Encounter (HOSPITAL_BASED_OUTPATIENT_CLINIC_OR_DEPARTMENT_OTHER): Payer: Self-pay | Admitting: *Deleted

## 2016-12-12 DIAGNOSIS — R05 Cough: Secondary | ICD-10-CM | POA: Diagnosis present

## 2016-12-12 DIAGNOSIS — B349 Viral infection, unspecified: Secondary | ICD-10-CM

## 2016-12-12 DIAGNOSIS — I10 Essential (primary) hypertension: Secondary | ICD-10-CM | POA: Diagnosis not present

## 2016-12-12 HISTORY — DX: Depression, unspecified: F32.A

## 2016-12-12 HISTORY — DX: Pure hypercholesterolemia, unspecified: E78.00

## 2016-12-12 HISTORY — DX: Major depressive disorder, single episode, unspecified: F32.9

## 2016-12-12 MED ORDER — METOCLOPRAMIDE HCL 10 MG PO TABS
10.0000 mg | ORAL_TABLET | Freq: Once | ORAL | Status: AC
  Administered 2016-12-12: 10 mg via ORAL
  Filled 2016-12-12: qty 1

## 2016-12-12 MED ORDER — ACETAMINOPHEN 500 MG PO TABS
1000.0000 mg | ORAL_TABLET | Freq: Once | ORAL | Status: AC
  Administered 2016-12-12: 1000 mg via ORAL
  Filled 2016-12-12: qty 2

## 2016-12-12 MED ORDER — IBUPROFEN 200 MG PO TABS
600.0000 mg | ORAL_TABLET | Freq: Once | ORAL | Status: AC
  Administered 2016-12-12: 600 mg via ORAL
  Filled 2016-12-12: qty 1

## 2016-12-12 NOTE — ED Provider Notes (Signed)
[facility] Provider Note   CSN: [id number] Arrival date & time: 12/12/16  2036  By signing my name below, I, Heather Woods, attest that this documentation has been prepared under the direction and in the presence of Tomasita CrumbleAdeleke Keaundra Stehle, MD . Electronically Signed: Freida Busmaniana Woods, Scribe. 12/12/2016. 11:11 PM.  History   Chief Complaint Chief Complaint  Patient presents with  . Influenza    The history is provided by the patient. No language interpreter was used.    HPI Comments:  Heather Woods is a 66 y.o. female who presents to the Emergency Department complaining of HA x 1 day with moderate pain. She reports associated persistent dry cough, sneezing, rhinorrhea, right ear pain, chills, and diarrhea. Pt notes recent sick contacts at home in her grandchild who had a viral illness.  She took tylenol and claritin at home with mild relief.    Past Medical History:  Diagnosis Date  . Anxiety   . Depression   . High cholesterol   . Hypercholesteremia   . Hypertension   . Panic attack     There are no active problems to display for this patient.   History reviewed. No pertinent surgical history.  OB History    No data available       Home Medications    Prior to Admission medications   Medication Sig Start Date End Date Taking? Authorizing Provider  cefpodoxime (VANTIN) 100 MG tablet Take 1 tablet (100 mg total) by mouth 2 (two) times daily. 12/16/14   Mirian MoMatthew Gentry, MD  clindamycin (CLEOCIN) 150 MG capsule Take 2 capsules (300 mg total) by mouth 3 (three) times daily. 08/24/15   John Molpus, MD  HYDROcodone-acetaminophen (NORCO/VICODIN) 5-325 MG tablet Take 1-2 tablets by mouth every 6 (six) hours as needed (for pain). 08/24/15   John Molpus, MD  lisinopril (PRINIVIL,ZESTRIL) 2.5 MG tablet Take 2.5 mg by mouth daily.    Historical Provider, MD  LOSARTAN POTASSIUM-HCTZ PO Take by mouth.    Historical Provider, MD  nitrofurantoin, macrocrystal-monohydrate,  (MACROBID) 100 MG capsule Take 1 capsule (100 mg total) by mouth 2 (two) times daily. 02/26/16   Eber HongBrian Miller, MD  ondansetron (ZOFRAN ODT) 4 MG disintegrating tablet 4mg  ODT q4 hours prn nausea/vomit 12/16/14   Mirian MoMatthew Gentry, MD  sertraline (ZOLOFT) 50 MG tablet Take 50 mg by mouth daily.    Historical Provider, MD    Family History History reviewed. No pertinent family history.  Social History Social History  Substance Use Topics  . Smoking status: Never Smoker  . Smokeless tobacco: Never Used  . Alcohol use No     Allergies   Penicillins   Review of Systems Review of Systems  10 systems reviewed and all are negative for acute change except as noted in the HPI.   Physical Exam Updated Vital Signs BP (!) 147/83 (BP Location: Right Arm)   Pulse 96   Temp (!) 101.3 F (38.5 C) (Oral)   Resp 20   SpO2 97%   Physical Exam  Constitutional: She is oriented to person, place, and time. She appears well-developed and well-nourished. No distress.  HENT:  Head: Normocephalic and atraumatic.  Right Ear: Tympanic membrane is erythematous.  Left Ear: Tympanic membrane is erythematous.  Nose: Nose normal.  Mouth/Throat: Oropharynx is clear and moist. No oropharyngeal exudate.  No purulence noted behind the ear drums.   Eyes: Conjunctivae and EOM are normal. Pupils are equal, round, and reactive to light. No scleral icterus.  Neck: Normal  range of motion. Neck supple. No JVD present. No tracheal deviation present. No thyromegaly present.  Cardiovascular: Normal rate, regular rhythm and normal heart sounds.  Exam reveals no gallop and no friction rub.   No murmur heard. Pulmonary/Chest: Effort normal and breath sounds normal. No respiratory distress. She has no wheezes. She exhibits no tenderness.  Abdominal: Soft. Bowel sounds are normal. She exhibits no distension and no mass. There is no tenderness. There is no rebound and no guarding.  Musculoskeletal: Normal range of motion. She  exhibits no edema or tenderness.  Lymphadenopathy:    She has no cervical adenopathy.  Neurological: She is alert and oriented to person, place, and time. No cranial nerve deficit. She exhibits normal muscle tone.  Skin: Skin is warm and dry. No rash noted. No erythema. No pallor.  Tactile fever   Nursing note and vitals reviewed.    ED Treatments / Results  DIAGNOSTIC STUDIES:  Oxygen Saturation is 97% on RA, normal by my interpretation.    COORDINATION OF CARE:  11:11 PM Discussed treatment plan with pt at bedside and pt agreed to plan.  Labs (all labs ordered are listed, but only abnormal results are displayed) Labs Reviewed - No data to display  EKG  EKG Interpretation None       Radiology No results found.  Procedures Procedures (including critical care time)  Medications Ordered in ED Medications  ibuprofen (ADVIL,MOTRIN) tablet 600 mg (600 mg Oral Given 12/12/16 2117)     Initial Impression / Assessment and Plan / ED Course  I have reviewed the triage vital signs and the nursing notes.  Pertinent labs & imaging results that were available during my care of the patient were reviewed by me and considered in my medical decision making (see chart for details).    Patient presents to the ED for viral symptoms.  She was given ibuprofen for fever and tylenol/reglan for her headache.  CXR did not show any pneumonia.  Advised on tylenol and ibuprofen at home as needed for her symptoms.  PCP fu advised within 3 days for further management.  She appears well and in NAD.  Fever has resolved.  VS remain within her normal limits and she is safe for DC.     Final Clinical Impressions(s) / ED Diagnoses   Final diagnoses:  None    New Prescriptions New Prescriptions   No medications on file     I personally performed the services described in this documentation, which was scribed in my presence. The recorded information has been reviewed and is accurate.         Tomasita Crumble, MD 12/12/16 [phone]

## 2016-12-12 NOTE — ED Triage Notes (Signed)
c/o HA, R ear ache, throat scratchy, nauseated, fever, R facial pressure, dry cough, nasal congestion, allergies, and some dizziness with standing (denies: body aches, sob, productive cough, CP), onset this morning, took tylenol 1000 mg at 1100, and claritin 10 mg at 1330. Alert, NAD, calm, interactive, resps e/u, speaking in clear complete sentences, no dyspnea noted, skin W&D, VSS.

## 2017-01-06 ENCOUNTER — Encounter (HOSPITAL_BASED_OUTPATIENT_CLINIC_OR_DEPARTMENT_OTHER): Payer: Self-pay | Admitting: Emergency Medicine

## 2017-01-06 ENCOUNTER — Emergency Department (HOSPITAL_BASED_OUTPATIENT_CLINIC_OR_DEPARTMENT_OTHER)
Admission: EM | Admit: 2017-01-06 | Discharge: 2017-01-07 | Disposition: A | Discharge status: Home / Self Care | Payer: BLUE CROSS/BLUE SHIELD | Attending: Emergency Medicine | Admitting: Emergency Medicine

## 2017-01-06 DIAGNOSIS — I10 Essential (primary) hypertension: Secondary | ICD-10-CM

## 2017-01-06 DIAGNOSIS — R197 Diarrhea, unspecified: Secondary | ICD-10-CM | POA: Insufficient documentation

## 2017-01-06 MED ORDER — GI COCKTAIL ~~LOC~~
30.0000 mL | Freq: Once | ORAL | Status: AC
  Administered 2017-01-07: 30 mL via ORAL
  Filled 2017-01-06: qty 30

## 2017-01-06 MED ORDER — AMLODIPINE BESYLATE 5 MG PO TABS
5.0000 mg | ORAL_TABLET | Freq: Once | ORAL | Status: AC
  Administered 2017-01-07: 5 mg via ORAL
  Filled 2017-01-06: qty 1

## 2017-01-06 NOTE — ED Triage Notes (Signed)
Pt checked BP this Pm at drug store and became concerned when it read 168/100 pt appears anxious and tearful in triage. Denies Cp [facility]

## 2017-01-06 NOTE — ED Provider Notes (Signed)
[facility] Provider Note   CSN: [id number] Arrival date & time: 01/06/17  2206  By signing my name below, I, Nelwyn Salisbury, attest that this documentation has been prepared under the direction and in the presence of Jadine Brumley, MD . Electronically Signed: Nelwyn Salisbury, Scribe. 01/06/2017. 11:41 PM.  History   Chief Complaint Chief Complaint  Patient presents with  . Hypertension   The history is provided by the patient. No language interpreter was used.  Hypertension  This is a chronic problem. The current episode started more than 1 week ago. The problem occurs constantly. The problem has been gradually worsening. Pertinent negatives include no chest pain, no abdominal pain, no headaches and no shortness of breath. Nothing aggravates the symptoms. Nothing relieves the symptoms. She has tried nothing for the symptoms. The treatment provided no relief.   HPI Comments:  Berdene Askari is a 66 y.o. female with pmhx of HLD and HTN who presents to the Emergency Department complaining of waxing/waning, mild hypertension which exacerbated today. She reports associated 3 days of heartburn, 1 day of diarrhea, and chronic lower back pain. Pt has been compliant with her normal HTN medication, Losartan. Denies any chest pain, vomiting or nausea. Pt has an appointment with her PCP in 5 days. No weakness no numbness no changes in vision or speech.  States the diarrhea is a sign of hear related trouble and that her BP is elevated because she is having a panic attack but can't say what this is related to ans states she needs to be given something to make her sleep.    Past Medical History:  Diagnosis Date  . Anxiety   . Depression   . High cholesterol   . Hypercholesteremia   . Hypertension   . Panic attack     There are no active problems to display for this patient.   History reviewed. No pertinent surgical history.  OB History    No data available       Home  Medications    Prior to Admission medications   Medication Sig Start Date End Date Taking? Authorizing Provider  lisinopril (PRINIVIL,ZESTRIL) 2.5 MG tablet Take 2.5 mg by mouth daily.    Historical Provider, MD  LOSARTAN POTASSIUM-HCTZ PO Take by mouth.    Historical Provider, MD  sertraline (ZOLOFT) 50 MG tablet Take 50 mg by mouth daily.    Historical Provider, MD    Family History History reviewed. No pertinent family history.  Social History Social History  Substance Use Topics  . Smoking status: Never Smoker  . Smokeless tobacco: Never Used  . Alcohol use No     Allergies   Penicillins   Review of Systems Review of Systems  Constitutional: Negative for diaphoresis and fever.  Respiratory: Negative for chest tightness, shortness of breath and stridor.   Cardiovascular: Negative for chest pain, palpitations and leg swelling.       Positive for Hypertension  Gastrointestinal: Positive for diarrhea. Negative for abdominal pain, nausea and vomiting.  Neurological: Negative for dizziness, seizures, syncope, facial asymmetry, speech difficulty, weakness, light-headedness, numbness and headaches.  Psychiatric/Behavioral: Negative for hallucinations and suicidal ideas.  All other systems reviewed and are negative.    Physical Exam Updated Vital Signs BP (!) 197/103 (BP Location: Right Arm)   Pulse 92   Temp 99.2 F (37.3 C) (Oral)   Resp 16   Ht  (1.6 m)   Wt 160 lb (72.6 kg)   SpO2 100%  BMI 28.34 kg/m   Physical Exam  Constitutional: She is oriented to person, place, and time. She appears well-developed and well-nourished.  HENT:  Head: Normocephalic.  Mouth/Throat: Oropharynx is clear and moist. No oropharyngeal exudate.  Eyes: Conjunctivae and EOM are normal. Pupils are equal, round, and reactive to light. Right eye exhibits no discharge. Left eye exhibits no discharge. No scleral icterus.  Neck: Normal range of motion. Neck supple. No JVD present. No  tracheal deviation present.  Trachea is midline. No stridor or carotid bruits.   Cardiovascular: Normal rate, regular rhythm, normal heart sounds and intact distal pulses.   No murmur heard. Pulmonary/Chest: Effort normal and breath sounds normal. No stridor. No respiratory distress. She has no wheezes. She has no rales.  Lungs CTA bilaterally.  Abdominal: Soft. Bowel sounds are normal. She exhibits no distension and no mass. There is no tenderness. There is no rebound and no guarding.  Musculoskeletal: Normal range of motion. She exhibits no edema or tenderness.  All compartments are soft. No palpable cords.   Lymphadenopathy:    She has no cervical adenopathy.  Neurological: She is alert and oriented to person, place, and time. She has normal reflexes. She displays normal reflexes. No cranial nerve deficit or sensory deficit. She exhibits normal muscle tone. Coordination normal.  CN II-XII intact.   Skin: Skin is warm and dry. Capillary refill takes less than 2 seconds.  Psychiatric: She has a normal mood and affect. Her behavior is normal.  Patient does not appear anxious  Nursing note and vitals reviewed.    ED Treatments / Results   Vitals:   01/07/17 0002 01/07/17 0236  BP: (!) 165/95 140/83  Pulse: 77   Resp: 16   Temp:     Results for orders placed or performed during the hospital encounter of 01/06/17  CBC with Differential/Platelet  Result Value Ref Range   WBC 8.7 4.0 - 10.5 K/uL   RBC 4.84 3.87 - 5.11 MIL/uL   Hemoglobin 13.1 12.0 - 15.0 g/dL   HCT 40.9 81.1 - 91.4 %   MCV 81.4 78.0 - 100.0 fL   MCH 27.1 26.0 - 34.0 pg   MCHC 33.2 30.0 - 36.0 g/dL   RDW 78.2 95.6 - 21.3 %   Platelets 290 150 - 400 K/uL   Neutrophils Relative % 69 %   Neutro Abs 6.0 1.7 - 7.7 K/uL   Lymphocytes Relative 22 %   Lymphs Abs 1.9 0.7 - 4.0 K/uL   Monocytes Relative 7 %   Monocytes Absolute 0.6 0.1 - 1.0 K/uL   Eosinophils Relative 2 %   Eosinophils Absolute 0.1 0.0 - 0.7 K/uL    Basophils Relative 0 %   Basophils Absolute 0.0 0.0 - 0.1 K/uL  Basic metabolic panel  Result Value Ref Range   Sodium 139 135 - 145 mmol/L   Potassium 3.6 3.5 - 5.1 mmol/L   Chloride 105 101 - 111 mmol/L   CO2 25 22 - 32 mmol/L   Glucose, Bld 130 (H) 65 - 99 mg/dL   BUN 9 6 - 20 mg/dL   Creatinine, Ser 0.86 0.44 - 1.00 mg/dL   Calcium 9.6 8.9 - 57.8 mg/dL   GFR calc non Af Amer >60 >60 mL/min   GFR calc Af Amer >60 >60 mL/min   Anion gap 9 5 - 15  Troponin I  Result Value Ref Range   Troponin I <0.03 <0.03 ng/mL   Dg Chest 2 View  Result Date: 01/07/2017  CLINICAL DATA:  Acute onset of high blood pressure and dizziness. Initial encounter. EXAM: CHEST  2 VIEW COMPARISON:  Chest radiograph performed 12/12/2016 FINDINGS: The lungs are well-aerated and clear. There is no evidence of focal opacification, pleural effusion or pneumothorax. The heart is normal in size; the mediastinal contour is within normal limits. No acute osseous abnormalities are seen. IMPRESSION: No acute cardiopulmonary process seen. Electronically Signed   By: Roanna Raider M.D.   On: 01/07/2017 02:11   Dg Chest 2 View  Result Date: 12/12/2016 CLINICAL DATA:  Acute onset of fever, cough and body aches. Generalized chest pain. Initial encounter. EXAM: CHEST  2 VIEW COMPARISON:  None. FINDINGS: The lungs are well-aerated and clear. There is no evidence of focal opacification, pleural effusion or pneumothorax. The heart is normal in size; the mediastinal contour is within normal limits. No acute osseous abnormalities are seen. IMPRESSION: No acute cardiopulmonary process seen. Electronically Signed   By: Roanna Raider M.D.   On: 12/12/2016 23:36    DIAGNOSTIC STUDIES: Oxygen Saturation is 100% on RA, normal by my interpretation.    COORDINATION OF CARE:  11:50 PM Discussed treatment plan with pt at bedside which includes giving her a new HTN medication and pt agreed to plan.  Labs (all labs ordered are listed, but  only abnormal results are displayed) Results for orders placed or performed during the hospital encounter of 01/06/17  CBC with Differential/Platelet  Result Value Ref Range   WBC 8.7 4.0 - 10.5 K/uL   RBC 4.84 3.87 - 5.11 MIL/uL   Hemoglobin 13.1 12.0 - 15.0 g/dL   HCT 16.1 09.6 - 04.5 %   MCV 81.4 78.0 - 100.0 fL   MCH 27.1 26.0 - 34.0 pg   MCHC 33.2 30.0 - 36.0 g/dL   RDW 40.9 81.1 - 91.4 %   Platelets 290 150 - 400 K/uL   Neutrophils Relative % 69 %   Neutro Abs 6.0 1.7 - 7.7 K/uL   Lymphocytes Relative 22 %   Lymphs Abs 1.9 0.7 - 4.0 K/uL   Monocytes Relative 7 %   Monocytes Absolute 0.6 0.1 - 1.0 K/uL   Eosinophils Relative 2 %   Eosinophils Absolute 0.1 0.0 - 0.7 K/uL   Basophils Relative 0 %   Basophils Absolute 0.0 0.0 - 0.1 K/uL  Basic metabolic panel  Result Value Ref Range   Sodium 139 135 - 145 mmol/L   Potassium 3.6 3.5 - 5.1 mmol/L   Chloride 105 101 - 111 mmol/L   CO2 25 22 - 32 mmol/L   Glucose, Bld 130 (H) 65 - 99 mg/dL   BUN 9 6 - 20 mg/dL   Creatinine, Ser 7.82 0.44 - 1.00 mg/dL   Calcium 9.6 8.9 - 95.6 mg/dL   GFR calc non Af Amer >60 >60 mL/min   GFR calc Af Amer >60 >60 mL/min   Anion gap 9 5 - 15  Troponin I  Result Value Ref Range   Troponin I <0.03 <0.03 ng/mL   Dg Chest 2 View  Result Date: 01/07/2017 CLINICAL DATA:  Acute onset of high blood pressure and dizziness. Initial encounter. EXAM: CHEST  2 VIEW COMPARISON:  Chest radiograph performed 12/12/2016 FINDINGS: The lungs are well-aerated and clear. There is no evidence of focal opacification, pleural effusion or pneumothorax. The heart is normal in size; the mediastinal contour is within normal limits. No acute osseous abnormalities are seen. IMPRESSION: No acute cardiopulmonary process seen. Electronically Signed   By: Leotis Shames  Chang M.D.   On: 01/07/2017 02:11   Dg Chest 2 View  Result Date: 12/12/2016 CLINICAL DATA:  Acute onset of fever, cough and body aches. Generalized chest pain.  Initial encounter. EXAM: CHEST  2 VIEW COMPARISON:  None. FINDINGS: The lungs are well-aerated and clear. There is no evidence of focal opacification, pleural effusion or pneumothorax. The heart is normal in size; the mediastinal contour is within normal limits. No acute osseous abnormalities are seen. IMPRESSION: No acute cardiopulmonary process seen. Electronically Signed   By: Roanna Raider M.D.   On: 12/12/2016 23:36    Procedures Procedures (including critical care time)  Medications Ordered in ED Medications  amLODipine (NORVASC) tablet 5 mg (5 mg Oral Given 01/07/17 0010)  gi cocktail (Maalox,Lidocaine,Donnatal) (30 mLs Oral Given 01/07/17 0010)  diphenhydrAMINE (BENADRYL) capsule 25 mg (25 mg Oral Given 01/07/17 0243)    EKG Interpretation  Date/Time:  Saturday Ninel Abdella 14 2018 00:01:41 EDT Ventricular Rate:  65 PR Interval:    QRS Duration: 143 QT Interval:  448 QTC Calculation: 466 R Axis:   42 Text Interpretation:  Sinus rhythm Right bundle branch block Confirmed by [facility]  MD, Morene Antu ([id number]) on 01/07/2017 1:01:48 AM          Final Clinical Impressions(s) / ED Diagnoses  Patient stated she believed the diarrhea is a sign of a cardiac problem.  EDP politely stated that diarrhea is not related to cardiac problems.  EDP also politely stated we do not give out tranquilizers or sleeping medication for blood pressure issues as she has asked multiple times for in the ED.  The patient does not even appear to be anxious and her only complaint in triage was HTN.   She states her BP being elevated is how she knows she is anxious.  BP improved in the ED patient ruled out for MI.   Exam, and vitals are benign and reassuring.The patient is nontoxic-appearing and imaging is negative for acute finding. Return for chest pain, wheezing, palpitations, weakness, numbness changes in vision or speech, intractable diarrhea, dyspnea on exertion or any concerns    After history, exam, and  medical workup I feel the patient has been appropriately medically screened and is safe for discharge home. Pertinent diagnoses were discussed with the patient. Patient was given return precautions.   I personally performed the services described in this documentation, which was scribed in my presence. The recorded information has been reviewed and is accurate.       Cy Blamer, MD 01/07/17 [phone]

## 2017-01-07 ENCOUNTER — Encounter (HOSPITAL_BASED_OUTPATIENT_CLINIC_OR_DEPARTMENT_OTHER): Payer: Self-pay | Admitting: Emergency Medicine

## 2017-01-07 ENCOUNTER — Emergency Department (HOSPITAL_BASED_OUTPATIENT_CLINIC_OR_DEPARTMENT_OTHER): Payer: BLUE CROSS/BLUE SHIELD

## 2017-01-07 LAB — CBC WITH DIFFERENTIAL/PLATELET
Basophils Absolute: 0 10*3/uL (ref 0.0–0.1)
Basophils Relative: 0 %
EOS ABS: 0.1 10*3/uL (ref 0.0–0.7)
EOS PCT: 2 %
HCT: 39.4 % (ref 36.0–46.0)
Hemoglobin: 13.1 g/dL (ref 12.0–15.0)
Lymphocytes Relative: 22 %
Lymphs Abs: 1.9 10*3/uL (ref 0.7–4.0)
MCH: 27.1 pg (ref 26.0–34.0)
MCHC: 33.2 g/dL (ref 30.0–36.0)
MCV: 81.4 fL (ref 78.0–100.0)
Monocytes Absolute: 0.6 10*3/uL (ref 0.1–1.0)
Monocytes Relative: 7 %
Neutro Abs: 6 10*3/uL (ref 1.7–7.7)
Neutrophils Relative %: 69 %
PLATELETS: 290 10*3/uL (ref 150–400)
RBC: 4.84 MIL/uL (ref 3.87–5.11)
RDW: 14 % (ref 11.5–15.5)
WBC: 8.7 10*3/uL (ref 4.0–10.5)

## 2017-01-07 LAB — BASIC METABOLIC PANEL
Anion gap: 9 (ref 5–15)
BUN: 9 mg/dL (ref 6–20)
CO2: 25 mmol/L (ref 22–32)
Calcium: 9.6 mg/dL (ref 8.9–10.3)
Chloride: 105 mmol/L (ref 101–111)
Creatinine, Ser: 0.78 mg/dL (ref 0.44–1.00)
GFR calc Af Amer: >60 mL/min (ref >60)
GFR calc non Af Amer: >60 mL/min (ref >60)
Glucose, Bld: 130 mg/dL — ABNORMAL HIGH (ref 65–99)
Potassium: 3.6 mmol/L (ref 3.5–5.1)
SODIUM: 139 mmol/L (ref 135–145)

## 2017-01-07 LAB — TROPONIN I

## 2017-01-07 MED ORDER — HYDROCHLOROTHIAZIDE 25 MG PO TABS
25.0000 mg | ORAL_TABLET | Freq: Every day | ORAL | 0 refills | Status: DC
Start: 2017-01-07 — End: 2017-09-08

## 2017-01-07 MED ORDER — DIPHENHYDRAMINE HCL 25 MG PO CAPS
25.0000 mg | ORAL_CAPSULE | Freq: Once | ORAL | Status: AC
  Administered 2017-01-07: 25 mg via ORAL
  Filled 2017-01-07: qty 1

## 2017-01-07 NOTE — ED Notes (Signed)
Patient transported to X-ray 

## 2017-09-05 ENCOUNTER — Ambulatory Visit: Payer: BLUE CROSS/BLUE SHIELD | Admitting: Cardiology

## 2017-09-08 ENCOUNTER — Encounter: Payer: Self-pay | Admitting: *Deleted

## 2017-09-08 ENCOUNTER — Ambulatory Visit: Payer: BLUE CROSS/BLUE SHIELD | Admitting: Cardiology

## 2017-09-08 ENCOUNTER — Encounter (INDEPENDENT_AMBULATORY_CARE_PROVIDER_SITE_OTHER): Payer: Self-pay

## 2017-09-08 VITALS — BP 102/62 | HR 71 | Ht 64.0 in | Wt 161.4 lb

## 2017-09-08 DIAGNOSIS — R0600 Dyspnea, unspecified: Secondary | ICD-10-CM

## 2017-09-08 DIAGNOSIS — R0789 Other chest pain: Secondary | ICD-10-CM | POA: Insufficient documentation

## 2017-09-08 DIAGNOSIS — R002 Palpitations: Secondary | ICD-10-CM | POA: Diagnosis not present

## 2017-09-08 DIAGNOSIS — R0609 Other forms of dyspnea: Secondary | ICD-10-CM | POA: Diagnosis not present

## 2017-09-08 DIAGNOSIS — F410 Panic disorder [episodic paroxysmal anxiety] without agoraphobia: Secondary | ICD-10-CM | POA: Insufficient documentation

## 2017-09-08 DIAGNOSIS — E782 Mixed hyperlipidemia: Secondary | ICD-10-CM

## 2017-09-08 DIAGNOSIS — J309 Allergic rhinitis, unspecified: Secondary | ICD-10-CM

## 2017-09-08 DIAGNOSIS — I10 Essential (primary) hypertension: Secondary | ICD-10-CM | POA: Diagnosis not present

## 2017-09-08 DIAGNOSIS — E785 Hyperlipidemia, unspecified: Secondary | ICD-10-CM

## 2017-09-08 DIAGNOSIS — F329 Major depressive disorder, single episode, unspecified: Secondary | ICD-10-CM | POA: Insufficient documentation

## 2017-09-08 DIAGNOSIS — F419 Anxiety disorder, unspecified: Secondary | ICD-10-CM | POA: Insufficient documentation

## 2017-09-08 DIAGNOSIS — E559 Vitamin D deficiency, unspecified: Secondary | ICD-10-CM | POA: Insufficient documentation

## 2017-09-08 DIAGNOSIS — E7800 Pure hypercholesterolemia, unspecified: Secondary | ICD-10-CM | POA: Insufficient documentation

## 2017-09-08 DIAGNOSIS — F32A Depression, unspecified: Secondary | ICD-10-CM | POA: Insufficient documentation

## 2017-09-08 HISTORY — DX: Other forms of dyspnea: R06.09

## 2017-09-08 HISTORY — DX: Palpitations: R00.2

## 2017-09-08 HISTORY — DX: Mixed hyperlipidemia: E78.2

## 2017-09-08 HISTORY — DX: Dyspnea, unspecified: R06.00

## 2017-09-08 HISTORY — DX: Essential (primary) hypertension: I10

## 2017-09-08 HISTORY — DX: Hyperlipidemia, unspecified: E78.5

## 2017-09-08 HISTORY — DX: Allergic rhinitis, unspecified: J30.9

## 2017-09-08 NOTE — Patient Instructions (Signed)
Medication Instructions:  Your physician recommends that you continue on your current medications as directed. Please refer to the Current Medication list given to you today.  Labwork: Your physician recommends that you have the following labs drawn: BMP and liver/lipid at appointment for testing.... Please come fasting.  Testing/Procedures: Your physician has requested that you have a stress echocardiogram. For further information please visit [email or web address]. Please follow instruction sheet as given.  Your physician has recommended that you wear a holter monitor. Holter monitors are medical devices that record the heart's electrical activity. Doctors most often use these monitors to diagnose arrhythmias. Arrhythmias are problems with the speed or rhythm of the heartbeat. The monitor is a small, portable device. You can wear one while you do your normal daily activities. This is usually used to diagnose what is causing palpitations/syncope (passing out).   Follow-Up: Your physician recommends that you schedule a follow-up appointment in: 2 months  Any Other Special Instructions Will Be Listed Below (If Applicable).     If you need a refill on your cardiac medications before your next appointment, please call your pharmacy.   [facility]  Garey HamAshley A, RN, BSN   Holter Monitoring A Holter monitor is a small device that is used to detect abnormal heart rhythms. It clips to your clothing and is connected by wires to flat, sticky disks (electrodes) that attach to your chest. It is worn continuously for 24-48 hours. Follow these instructions at home:  Wear your Holter monitor at all times, even while exercising and sleeping, for as long as directed by your health care provider.  Make sure that the Holter monitor is safely clipped to your clothing or close to your body as recommended by your health care provider.  Do not get the monitor or wires wet.  Do not put body lotion or  moisturizer on your chest.  Keep your skin clean.  Keep a diary of your daily activities, such as walking and doing chores. If you feel that your heartbeat is abnormal or that your heart is fluttering or skipping a beat: ? Record what you are doing when it happens. ? Record what time of day the symptoms occur.  Return your Holter monitor as directed by your health care provider.  Keep all follow-up visits as directed by your health care provider. This is important. Get help right away if:  You feel lightheaded or you faint.  You have trouble breathing.  You feel pain in your chest, upper arm, or jaw.  You feel sick to your stomach and your skin is pale, cool, or damp.  You heartbeat feels unusual or abnormal. This information is not intended to replace advice given to you by your health care provider. Make sure you discuss any questions you have with your health care provider. Document Released: 06/10/2004 Document Revised: 02/18/2016 Document Reviewed: 04/21/2014 Elsevier Interactive Patient Education  2018 [employer]  Exercise Stress Echocardiogram An exercise stress echocardiogram is a test to check how well your heart is working. This test uses sound waves (ultrasound) and a computer to make images of your heart before and after exercise. Ultrasound images that are taken before you exercise (your resting echocardiogram) will show how much blood is getting to your heart muscle and how well your heart muscle and heart valves are functioning. During the next part of this test, you will walk on a treadmill or ride a stationary bike to see how exercise affects your heart. While you exercise, the electrical  activity of your heart will be monitored with an electrocardiogram (ECG). Your blood pressure will also be monitored. You may have this test if you:  Have chest pain or other symptoms of a heart problem.  Recently had a heart attack or heart surgery.  Have heart valve  problems.  Have a condition that causes narrowing of the blood vessels that supply your heart (coronary artery disease).  Have a high risk of heart disease and are starting a new exercise program.  Have a high risk of heart disease and need to have major surgery.  Tell a health care provider about:  Any allergies you have.  All medicines you are taking, including vitamins, herbs, eye drops, creams, and over-the-counter medicines.  Any problems you or family members have had with anesthetic medicines.  Any blood disorders you have.  Any surgeries you have had.  Any medical conditions you have.  Whether you are pregnant or may be pregnant. What are the risks? Generally, this is a safe procedure. However, problems may occur, including:  Chest pain.  Dizziness or light-headedness.  Shortness of breath.  Increased or irregular heartbeat (palpitations).  Nausea or vomiting.  Heart attack (very rare).  What happens before the procedure?  Follow instructions from your health care provider about eating or drinking restrictions. You may be asked to avoid all forms of caffeine for 24 hours before your procedure, or as told by your health care provider.  Ask your health care provider about changing or stopping your regular medicines. This is especially important if you are taking diabetes medicines or blood thinners.  If you use an inhaler, bring it with you to the test.  Wear loose, comfortable clothing and walking shoes.  Do notuse any products that contain nicotine or tobacco, such as cigarettes and e-cigarettes, for 4 hours before the test or as told by your health care provider. If you need help quitting, ask your health care provider. What happens during the procedure?  You will take off your clothes from the waist up and put on a hospital gown.  A technician will place electrodes on your chest.  A blood pressure cuff will be placed on your arm.  You will lie down  on a table for an ultrasound exam before you exercise. Gel will be rubbed on your chest, and a handheld device (transducer) will be pressed against your chest and moved over your heart.  Then, you will start exercising by walking on a treadmill or pedaling a stationary bicycle.  Your blood pressure and heart rhythm will be monitored while you exercise.  The exercise will gradually get harder or faster.  You will exercise until: ? Your heart reaches a target level. ? You are too tired to continue. ? You cannot continue because of chest pain, weakness, or dizziness.  You will have another ultrasound exam after you stop exercising. The procedure may vary among health care providers and hospitals. What happens after the procedure?  Your heart rate and blood pressure will be monitored until they return to your normal levels. Summary  An exercise stress echocardiogram is a test that uses ultrasound to check how well your heart works before and after exercise.  Before the test, follow instructions from your health care provider about stopping medications, avoiding nicotine and tobacco, and avoiding certain foods and drinks.  During the test, your blood pressure and heart rhythm will be monitored while you exercise on a treadmill or stationary bicycle. This information is not intended  to replace advice given to you by your health care provider. Make sure you discuss any questions you have with your health care provider. Document Released: 09/16/2004 Document Revised: 05/04/2016 Document Reviewed: 05/04/2016 Elsevier Interactive Patient Education  2018 [employer].

## 2017-09-08 NOTE — Progress Notes (Signed)
Cardiology Office Note:    Date:  09/08/2017   [name], DOB 01/19/1951, MRN [id number]  PCP:  Lucianne LeiUppin, Nina, MD  Cardiologist:  Garwin Brothersajan R Revankar, MD   Referring MD: Lucianne LeiUppin, Nina, MD    ASSESSMENT:    1. Essential hypertension   2. Dyslipidemia   3. Chest discomfort    PLAN:    In order of problems listed above:  1. Primary prevention stressed with the patient.  Importance of compliance with diet and medications stressed and she vocalized understanding. 2. Patient has issues with palpitations.  Her TSH is fine.  We will do a 48-hour Holter monitoring to assess these palpitations to see if she has any significant arrhythmias this may be causing the symptoms 3. Her blood pressure is stable 4. With her hyperlipidemia, I agree with her primary care physician.  She has been initiated on statins.  Diet was discussed.  I told her that we will recheck her lipids when she comes back in 2 months for follow-up. 5. To assess her shortness of breath on exertion we will do an exercise stress echo. 6. She knows to go to the nearest emergency room for any significant concerns.   Medication Adjustments/Labs and Tests Ordered: Current medicines are reviewed at length with the patient today.  Concerns regarding medicines are outlined above.  No orders of the defined types were placed in this encounter.  No orders of the defined types were placed in this encounter.    History of Present Illness:    Heather Woods is a 66 y.o. female who is being seen today for the evaluation of palpitations and dyspnea on exertion  at the request of Lucianne LeiUppin, Nina, MD.  Patient is a pleasant 66 year old female.  She has past medical history of marked dyslipidemia and essential hypertension.  She mentions to me that she has been having palpitations 3 or 4 times a week.  No orthopnea no PND.  No syncope.  These lasts a few seconds but come on and off.  These have been concerning to her and have affected her  quality of life.  She also has shortness of breath on exertion especially when she goes up a flight of stairs.  No chest pain no orthopnea or PND.  No radiation of any kind of symptoms to the neck or to the arms.  At the time of my evaluation, the patient is alert awake oriented and in no distress.  She does not exercise on a regular basis.  Past Medical History:  Diagnosis Date  . Allergic rhinitis 09/08/2017  . Anxiety   . Depression   . High cholesterol   . Hypercholesteremia   . Hypertension   . Panic attack     Past Surgical History:  Procedure Laterality Date  . BREAST BIOPSY  2006   Pathology normal  . TONSILLECTOMY  1999  . TUBAL LIGATION  1983    Current Medications: Current Meds  Medication Sig  . atorvastatin (LIPITOR) 40 MG tablet Take 1 tablet by mouth daily.  Marland Kitchen. losartan-hydrochlorothiazide (HYZAAR) 50-12.5 MG tablet Take 1 tablet by mouth daily.   . sertraline (ZOLOFT) 100 MG tablet Take 100 mg by mouth daily.   . Vitamin D, Ergocalciferol, (DRISDOL) 50000 units CAPS capsule Take 1 capsule by mouth once a week.     Allergies:   Lisinopril and Penicillins   Social History   Socioeconomic History  . Marital status: Single    Spouse name: None  . Number  of children: None  . Years of education: None  . Highest education level: None  Social Needs  . Financial resource strain: None  . Food insecurity - worry: None  . Food insecurity - inability: None  . Transportation needs - medical: None  . Transportation needs - non-medical: None  Occupational History  . None  Tobacco Use  . Smoking status: Never Smoker  . Smokeless tobacco: Never Used  Substance and Sexual Activity  . Alcohol use: No  . Drug use: No  . Sexual activity: Yes    Birth control/protection: Post-menopausal  Other Topics Concern  . None  Social History Narrative  . None     Family History: The patient's family history includes Diabetes in her brother and mother; Hypertension in her  brother, father, and sister.  ROS:   Please see the history of present illness.    All other systems reviewed and are negative.  EKGs/Labs/Other Studies Reviewed:    The following studies were reviewed today: I discussed my findings with the patient at extensive length.  EKG done today reveals sinus rhythm and nonspecific ST-T changes records including lab work from primary care physician's office including lipids were reviewed and discussed with the patient extensively.    Recent Labs: 01/07/2017: BUN 9; Creatinine, Ser 0.78; Hemoglobin 13.1; Platelets 290; Potassium 3.6; Sodium 139  Recent Lipid Panel No results found for: CHOL, TRIG, HDL, CHOLHDL, VLDL, LDLCALC, LDLDIRECT  Physical Exam:    VS:  BP 102/62   Pulse 71   Ht 5\' 4"  (1.626 m)   Wt 161 lb 6.4 oz (73.2 kg)   SpO2 97%   BMI 27.70 kg/m     Wt Readings from Last 3 Encounters:  09/08/17 161 lb 6.4 oz (73.2 kg)  01/06/17 160 lb (72.6 kg)  10/04/16 171 lb 1 oz (77.6 kg)     GEN: Patient is in no acute distress HEENT: Normal NECK: No JVD; No carotid bruits LYMPHATICS: No lymphadenopathy CARDIAC: S1 S2 regular, 2/6 systolic murmur at the apex. RESPIRATORY:  Clear to auscultation without rales, wheezing or rhonchi  ABDOMEN: Soft, non-tender, non-distended MUSCULOSKELETAL:  No edema; No deformity  SKIN: Warm and dry NEUROLOGIC:  Alert and oriented x 3 PSYCHIATRIC:  Normal affect    Signed, Garwin Brothersajan R Revankar, MD  09/08/2017 10:14 AM    [facility]

## 2017-09-11 NOTE — Addendum Note (Signed)
Addended by: Craige CottaANDERSON, Tomas Schamp S on: 09/11/2017 08:35 AM   Modules accepted: Orders

## 2017-09-29 ENCOUNTER — Ambulatory Visit (HOSPITAL_BASED_OUTPATIENT_CLINIC_OR_DEPARTMENT_OTHER): Payer: BLUE CROSS/BLUE SHIELD

## 2017-10-30 ENCOUNTER — Ambulatory Visit (HOSPITAL_BASED_OUTPATIENT_CLINIC_OR_DEPARTMENT_OTHER)
Admission: RE | Admit: 2017-10-30 | Discharge: 2017-10-30 | Disposition: A | Discharge status: Home / Self Care | Payer: BLUE CROSS/BLUE SHIELD | Source: Ambulatory Visit | Attending: Cardiology | Admitting: Cardiology

## 2017-10-30 DIAGNOSIS — I10 Essential (primary) hypertension: Secondary | ICD-10-CM | POA: Insufficient documentation

## 2017-10-30 DIAGNOSIS — R0609 Other forms of dyspnea: Secondary | ICD-10-CM

## 2017-10-30 DIAGNOSIS — R002 Palpitations: Secondary | ICD-10-CM | POA: Diagnosis not present

## 2017-10-30 DIAGNOSIS — E785 Hyperlipidemia, unspecified: Secondary | ICD-10-CM | POA: Diagnosis not present

## 2017-10-30 NOTE — Progress Notes (Signed)
Echocardiogram Echocardiogram Stress Test has been performed.  Heather Woods, Heather Woods M 10/30/2017, 3:13 PM

## 2017-11-10 ENCOUNTER — Ambulatory Visit: Payer: BLUE CROSS/BLUE SHIELD | Admitting: Cardiology

## 2017-12-13 ENCOUNTER — Ambulatory Visit: Payer: BLUE CROSS/BLUE SHIELD | Admitting: Cardiology

## 2017-12-13 DIAGNOSIS — R0989 Other specified symptoms and signs involving the circulatory and respiratory systems: Secondary | ICD-10-CM

## 2017-12-26 DIAGNOSIS — Z6829 Body mass index (BMI) 29.0-29.9, adult: Secondary | ICD-10-CM | POA: Diagnosis not present

## 2017-12-26 DIAGNOSIS — R739 Hyperglycemia, unspecified: Secondary | ICD-10-CM | POA: Diagnosis not present

## 2017-12-26 DIAGNOSIS — Z79899 Other long term (current) drug therapy: Secondary | ICD-10-CM | POA: Diagnosis not present

## 2017-12-26 DIAGNOSIS — E785 Hyperlipidemia, unspecified: Secondary | ICD-10-CM | POA: Diagnosis not present

## 2017-12-26 DIAGNOSIS — E663 Overweight: Secondary | ICD-10-CM | POA: Diagnosis not present

## 2017-12-26 DIAGNOSIS — R002 Palpitations: Secondary | ICD-10-CM | POA: Diagnosis not present

## 2017-12-26 DIAGNOSIS — I10 Essential (primary) hypertension: Secondary | ICD-10-CM | POA: Diagnosis not present

## 2017-12-26 DIAGNOSIS — Z9181 History of falling: Secondary | ICD-10-CM | POA: Diagnosis not present

## 2017-12-26 DIAGNOSIS — Z23 Encounter for immunization: Secondary | ICD-10-CM | POA: Diagnosis not present

## 2017-12-26 DIAGNOSIS — H6123 Impacted cerumen, bilateral: Secondary | ICD-10-CM | POA: Diagnosis not present

## 2017-12-29 ENCOUNTER — Ambulatory Visit: Payer: Medicare HMO | Admitting: Cardiology

## 2017-12-29 ENCOUNTER — Encounter: Payer: Self-pay | Admitting: Cardiology

## 2017-12-29 VITALS — BP 130/72 | HR 63 | Ht 64.0 in | Wt 164.0 lb

## 2017-12-29 DIAGNOSIS — I10 Essential (primary) hypertension: Secondary | ICD-10-CM

## 2017-12-29 DIAGNOSIS — E785 Hyperlipidemia, unspecified: Secondary | ICD-10-CM | POA: Diagnosis not present

## 2017-12-29 NOTE — Patient Instructions (Signed)
Medication Instructions:   Your physician recommends that you continue on your current medications as directed. Please refer to the Current Medication list given to you today.  Labwork:  None  Testing/Procedures:  None  Follow-Up:  Your physician recommends that you schedule a follow-up appointment in: as needed.  Any Other Special Instructions Will Be Listed Below (If Applicable).  If you need a refill on your cardiac medications before your next appointment, please call your pharmacy. 

## 2017-12-29 NOTE — Progress Notes (Signed)
Cardiology Office Note:    Date:  12/29/2017   [name], DOB [birth year], MRN [id number]  PCP:  Heather Woods, Nina, MD  Cardiologist:  Heather Brothersajan R Revankar, MD   Referring MD: Heather Woods, Nina, MD    ASSESSMENT:    1. Essential hypertension   2. Dyslipidemia    PLAN:    In order of problems listed above:  1. Primary prevention stressed to the patient.  Importance of compliance with diet and exercise stressed.  I told her about regular exercise program and losing weight.  Is very happy with the stress test results are fine.  She is going to initiate an exercise program.  Lipids will be followed by her primary care physician.  Her blood pressure is stable.  She will be seen in follow-up appointment on a as needed basis only.   Medication Adjustments/Labs and Tests Ordered: Current medicines are reviewed at length with the patient today.  Concerns regarding medicines are outlined above.  No orders of the defined types were placed in this encounter.  No orders of the defined types were placed in this encounter.    Chief Complaint  Patient presents with  . Referral    Heather LeiNina Uppin     History of Present Illness:    Heather DuelLecia Lovan is a 67 y.o. female.  The patient was evaluated for symptoms of chest pain and chest tightness.  She did very well on the stress test and subsequently has done fine.  She is very happy with the results of this test and has started exercising on a regular basis.  Past Medical History:  Diagnosis Date  . Adult BMI 29.0-29.9 kg/sq m   . Allergic rhinitis 09/08/2017  . Anxiety   . Depression   . DM (diabetes mellitus), type 2 (HCC)   . High cholesterol   . Hypercholesteremia   . Hyperlipidemia   . Hypertension   . Panic attack   . Vitamin D deficiency     Past Surgical History:  Procedure Laterality Date  . BREAST BIOPSY  2006   Pathology normal  . TONSILLECTOMY  1999  . TUBAL LIGATION  1983    Current Medications: Current Meds  Medication  Sig  . atorvastatin (LIPITOR) 40 MG tablet Take 1 tablet by mouth daily.  . Hydrocortisone-Aloe 0.5 % CREA   . losartan-hydrochlorothiazide (HYZAAR) 50-12.5 MG tablet Take 1 tablet by mouth daily.   . montelukast (SINGULAIR) 10 MG tablet TK 1 T PO HS  . sertraline (ZOLOFT) 100 MG tablet Take 100 mg by mouth daily.   . Vitamin D, Ergocalciferol, (DRISDOL) 50000 units CAPS capsule Take 1 capsule by mouth once a week.     Allergies:   Lisinopril and Penicillins   Social History   Socioeconomic History  . Marital status: Divorced    Spouse name: Not on file  . Number of children: Not on file  . Years of education: Not on file  . Highest education level: Not on file  Occupational History  . Not on file  Social Needs  . Financial resource strain: Not on file  . Food insecurity:    Worry: Not on file    Inability: Not on file  . Transportation needs:    Medical: Not on file    Non-medical: Not on file  Tobacco Use  . Smoking status: Never Smoker  . Smokeless tobacco: Never Used  Substance and Sexual Activity  . Alcohol use: No  . Drug use: No  .  Sexual activity: Yes    Birth control/protection: Post-menopausal  Lifestyle  . Physical activity:    Days per week: Not on file    Minutes per session: Not on file  . Stress: Not on file  Relationships  . Social connections:    Talks on phone: Not on file    Gets together: Not on file    Attends religious service: Not on file    Active member of club or organization: Not on file    Attends meetings of clubs or organizations: Not on file    Relationship status: Not on file  Other Topics Concern  . Not on file  Social History Narrative  . Not on file     Family History: The patient's family history includes Diabetes in her brother and mother; Hypertension in her brother, father, and sister.  ROS:   Please see the history of present illness.    All other systems reviewed and are negative.  EKGs/Labs/Other Studies  Reviewed:    The following studies were reviewed today: I reviewed the stress test report with the patient at length.   Recent Labs: 01/07/2017: BUN 9; Creatinine, Ser 0.78; Hemoglobin 13.1; Platelets 290; Potassium 3.6; Sodium 139  Recent Lipid Panel No results found for: CHOL, TRIG, HDL, CHOLHDL, VLDL, LDLCALC, LDLDIRECT  Physical Exam:    VS:  BP 130/72 (BP Location: Right Arm, Patient Position: Sitting, Cuff Size: Normal)   Pulse 63   Ht 5\' 4"  (1.626 m)   Wt 164 lb (74.4 kg)   SpO2 98%   BMI 28.15 kg/m     Wt Readings from Last 3 Encounters:  12/29/17 164 lb (74.4 kg)  09/08/17 161 lb 6.4 oz (73.2 kg)  01/06/17 160 lb (72.6 kg)     GEN: Patient is in no acute distress HEENT: Normal NECK: No JVD; No carotid bruits LYMPHATICS: No lymphadenopathy CARDIAC: Hear sounds regular, 2/6 systolic murmur at the apex. RESPIRATORY:  Clear to auscultation without rales, wheezing or rhonchi  ABDOMEN: Soft, non-tender, non-distended MUSCULOSKELETAL:  No edema; No deformity  SKIN: Warm and dry NEUROLOGIC:  Alert and oriented x 3 PSYCHIATRIC:  Normal affect   Signed, Heather Brothers, MD  12/29/2017 10:01 AM    [facility]

## 2018-01-16 DIAGNOSIS — E119 Type 2 diabetes mellitus without complications: Secondary | ICD-10-CM | POA: Diagnosis not present

## 2018-03-13 DIAGNOSIS — Z1231 Encounter for screening mammogram for malignant neoplasm of breast: Secondary | ICD-10-CM | POA: Diagnosis not present

## 2018-04-11 IMAGING — CR DG CHEST 2V
2 series · 2 of 2 positions shown · non-contrast
Comparison: Chest radiograph performed 12/12/2016

CLINICAL DATA: Acute onset of high blood pressure and dizziness.
Initial encounter.

EXAM:
CHEST  2 VIEW

[w chest pa]
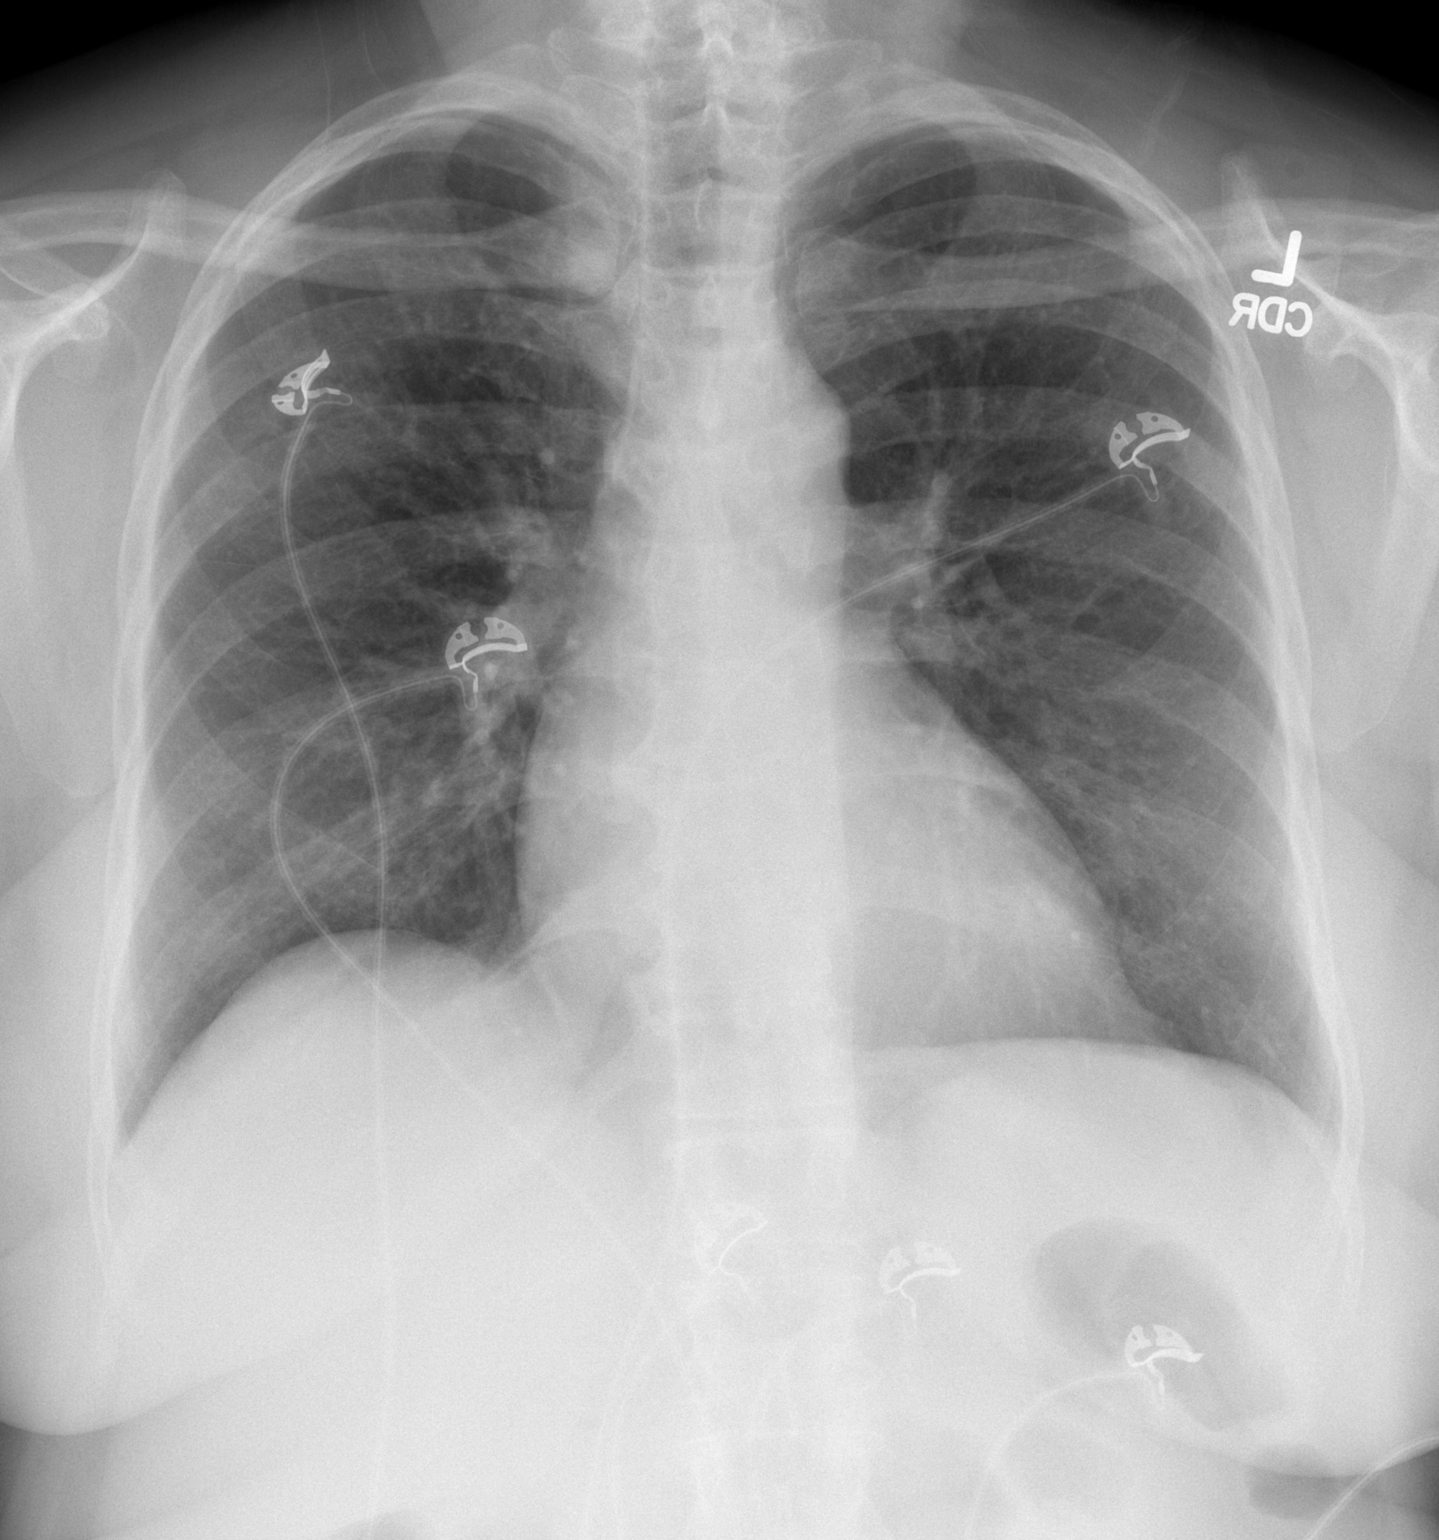

[w chest lat]
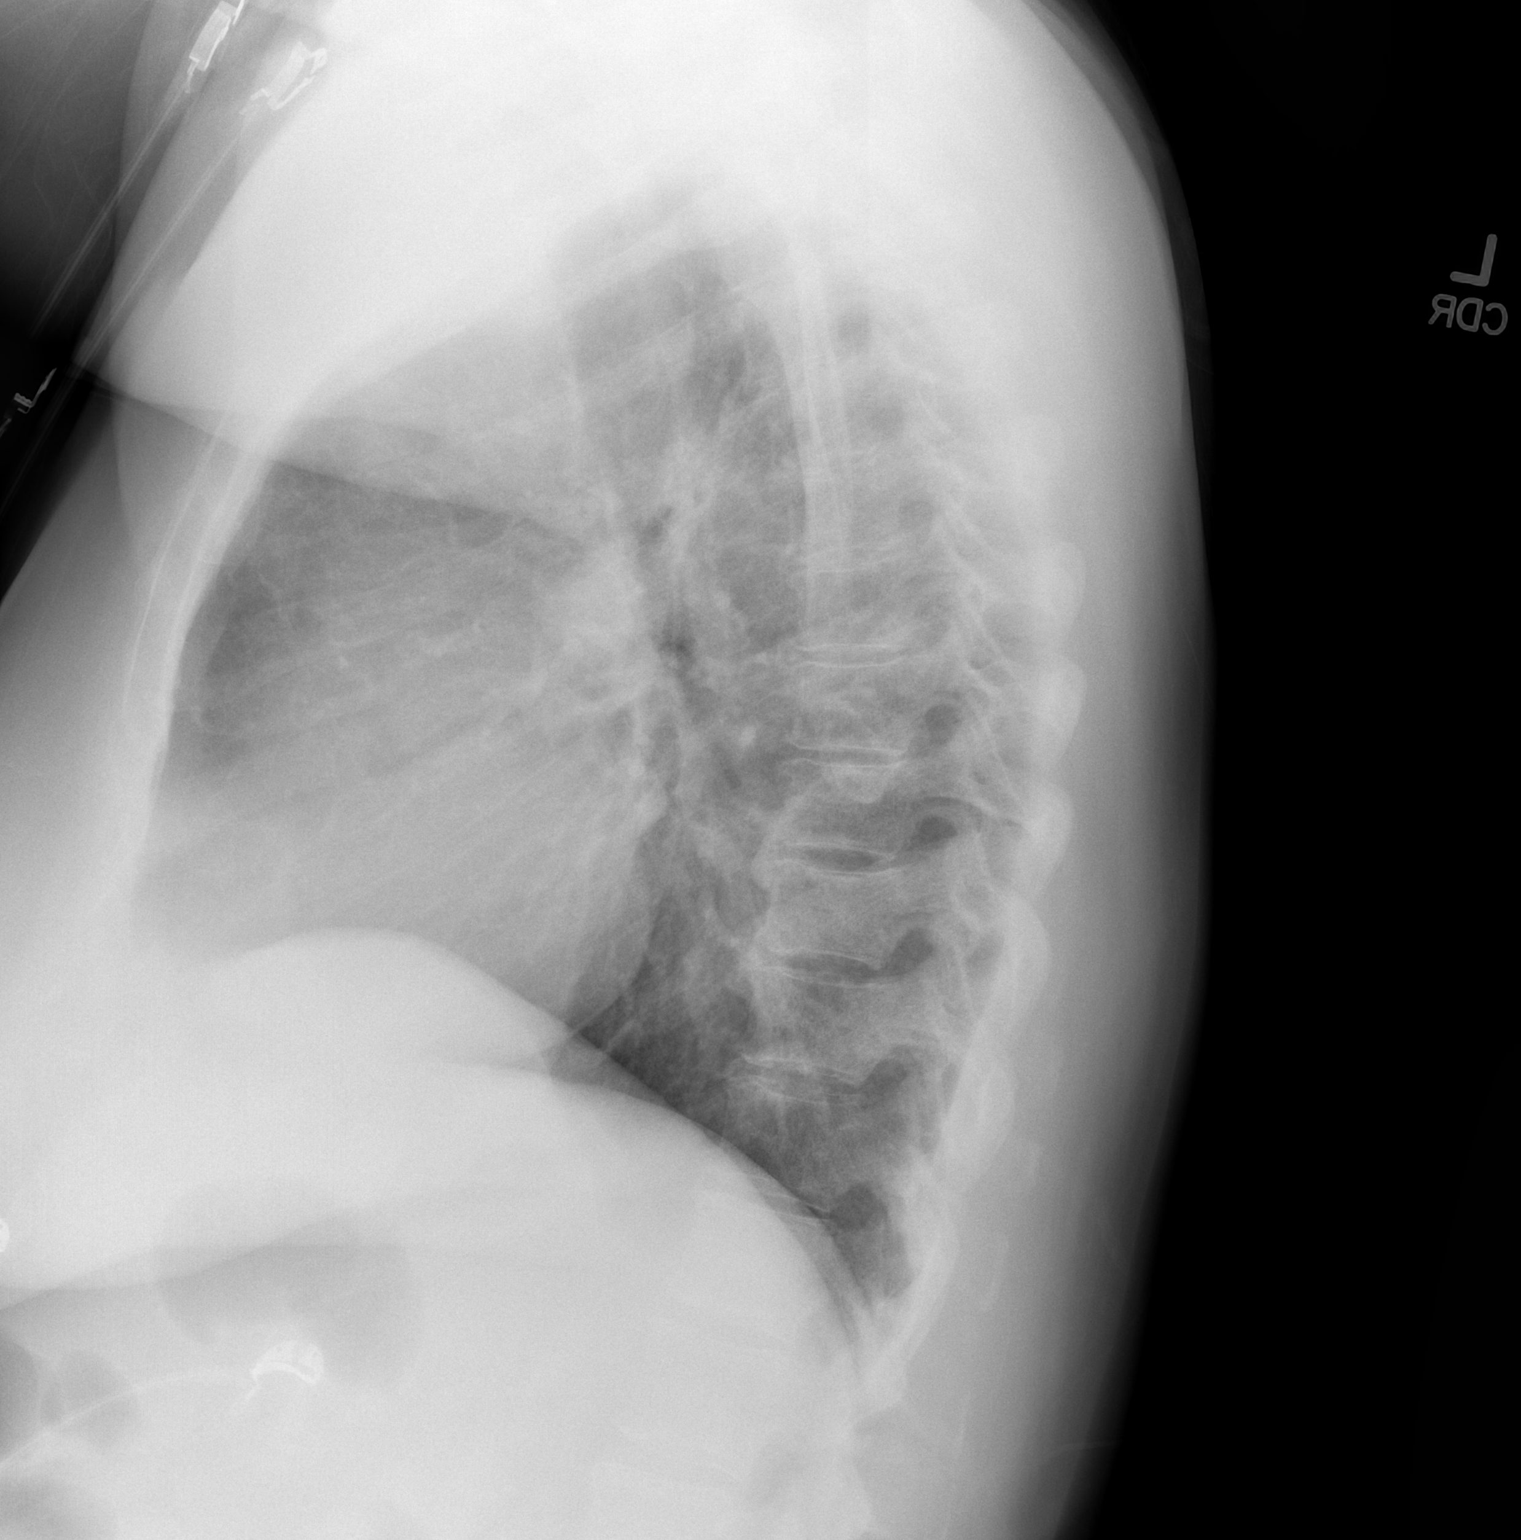

[2 of 2 positions shown; findings below may reference images not displayed]

FINDINGS: The lungs are well-aerated and clear. There is no evidence of focal
opacification, pleural effusion or pneumothorax.

The heart is normal in size; the mediastinal contour is within
normal limits. No acute osseous abnormalities are seen.
IMPRESSION: No acute cardiopulmonary process seen.

## 2018-08-14 ENCOUNTER — Emergency Department (HOSPITAL_BASED_OUTPATIENT_CLINIC_OR_DEPARTMENT_OTHER)
Admission: EM | Admit: 2018-08-14 | Discharge: 2018-08-14 | Disposition: A | Discharge status: Home / Self Care | Payer: Medicare HMO | Attending: Emergency Medicine | Admitting: Emergency Medicine

## 2018-08-14 ENCOUNTER — Emergency Department (HOSPITAL_BASED_OUTPATIENT_CLINIC_OR_DEPARTMENT_OTHER): Payer: Medicare HMO

## 2018-08-14 ENCOUNTER — Other Ambulatory Visit: Payer: Self-pay

## 2018-08-14 ENCOUNTER — Encounter (HOSPITAL_BASED_OUTPATIENT_CLINIC_OR_DEPARTMENT_OTHER): Payer: Self-pay | Admitting: *Deleted

## 2018-08-14 DIAGNOSIS — E119 Type 2 diabetes mellitus without complications: Secondary | ICD-10-CM | POA: Diagnosis not present

## 2018-08-14 DIAGNOSIS — M25511 Pain in right shoulder: Secondary | ICD-10-CM | POA: Insufficient documentation

## 2018-08-14 DIAGNOSIS — I10 Essential (primary) hypertension: Secondary | ICD-10-CM | POA: Diagnosis not present

## 2018-08-14 MED ORDER — METHOCARBAMOL 500 MG PO TABS: 500.0000 mg | ORAL_TABLET | Freq: Three times a day (TID) | ORAL | 0 refills | Status: DC | PRN

## 2018-08-14 MED FILL — METHOCARBAMOL 500 MG TABLET: 500 | 5 days supply | Qty: 15 | Fill #0

## 2018-08-14 NOTE — Discharge Instructions (Signed)

## 2018-08-14 NOTE — ED Triage Notes (Signed)
Right shoulder pain. She thinks she hurt it at work lifting. Limited ROM.

## 2018-08-14 NOTE — ED Notes (Signed)
ED Provider at bedside discussing test results and dispo plan of care. 

## 2018-08-14 NOTE — ED Provider Notes (Signed)
Emergency Department Provider Note   I have reviewed the triage vital signs and the nursing notes.   HISTORY  Chief Complaint Shoulder Injury   HPI Heather Woods is a 67 y.o. female with PMH of HLD, HTN, and DM's to the emergency department for evaluation of right shoulder pain.  Patient is a job which involves lifting and she thinks she may have injured the shoulder while at work.  She has severe pain with range of motion of the right shoulder.  She denies fevers, chills, joint redness.  No pain in the elbow or wrist.  Denies any radiation of pain to the chest or back.  No shortness of breath, heart palpitations, diaphoresis.  Patient has been taking Tylenol with little to no relief.  Past Medical History:  Diagnosis Date  . Adult BMI 29.0-29.9 kg/sq m   . Allergic rhinitis 09/08/2017  . Anxiety   . Depression   . DM (diabetes mellitus), type 2 (HCC)   . High cholesterol   . Hypercholesteremia   . Hyperlipidemia   . Hypertension   . Panic attack   . Vitamin D deficiency     Patient Active Problem List   Diagnosis Date Noted  . Vitamin D deficiency 09/08/2017  . Allergic rhinitis 09/08/2017  . Essential hypertension 09/08/2017  . Dyslipidemia 09/08/2017  . Dyspnea on exertion 09/08/2017  . Palpitations 09/08/2017  . Panic attack   . Depression   . Anxiety     Past Surgical History:  Procedure Laterality Date  . BREAST BIOPSY  2006   Pathology normal  . TONSILLECTOMY  1999  . TUBAL LIGATION  1983   Allergies Lisinopril and Penicillins  Family History  Problem Relation Age of Onset  . Diabetes Mother   . Hypertension Father   . Hypertension Sister   . Hypertension Brother   . Diabetes Brother     Social History Social History   Tobacco Use  . Smoking status: Never Smoker  . Smokeless tobacco: Never Used  Substance Use Topics  . Alcohol use: No  . Drug use: No    Review of Systems  Constitutional: No fever/chills Eyes: No visual  changes. ENT: No sore throat. Cardiovascular: Denies chest pain. Respiratory: Denies shortness of breath. Gastrointestinal: No abdominal pain.  No nausea, no vomiting.  No diarrhea.  No constipation. Genitourinary: Negative for dysuria. Musculoskeletal: Negative for back pain. Positive shoulder pain.  Skin: Negative for rash. Neurological: Negative for headaches, focal weakness or numbness.  10-point ROS otherwise negative.  ____________________________________________   PHYSICAL EXAM:  VITAL SIGNS: ED Triage Vitals  Enc Vitals Group     BP 08/14/18 1602 (!) 167/98     Pulse Rate 08/14/18 1602 77     Resp 08/14/18 1602 18     Temp 08/14/18 1602 98.4 F (36.9 C)     Temp Source 08/14/18 1602 Oral     SpO2 08/14/18 1602 98 %     Weight 08/14/18 1600 150 lb (68 kg)     Height 08/14/18 1600 5\' 4"  (1.626 m)   Constitutional: Alert and oriented. Well appearing and in no acute distress. Eyes: Conjunctivae are normal.  Head: Atraumatic. Nose: No congestion/rhinnorhea. Mouth/Throat: Mucous membranes are moist.  Neck: No stridor.   Cardiovascular: Grossly normal heart sounds.   Respiratory: Normal respiratory effort.  Musculoskeletal: Pain with passive and active ROM of the right shoulder. Tenderness to palpation over the anterior right shoulder.  Neurologic:  Normal speech and language. No gross focal  neurologic deficits are appreciated.  Skin:  Skin is warm, dry and intact. No rash noted.  ____________________________________________  RADIOLOGY  Dg Shoulder Right  Result Date: 08/14/2018 CLINICAL DATA:  Pain for 2 days EXAM: RIGHT SHOULDER - 2+ VIEW COMPARISON:  None. FINDINGS: Oblique, Y scapular, and axillary images were obtained. There is no demonstrable fracture or dislocation. There is osteoarthritic change in the acromioclavicular joint. There is calcification superior to the lateral humeral head consistent with calcific tendinosis. No erosive change. Visualized right  lung clear. 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 No evident fracture or dislocation. Electronically Signed   By: Bretta Bang III M.D.   On: 08/14/2018 16:50    ____________________________________________   PROCEDURES  Procedure(s) performed:   Procedures  None ____________________________________________   INITIAL IMPRESSION / ASSESSMENT AND PLAN / ED COURSE  Pertinent labs & imaging results that were available during my care of the patient were reviewed by me and considered in my medical decision making (see chart for details).  She presents to the emergency department for evaluation of right shoulder pain.  She has tenderness with palpation over the anterior right shoulder and pain with range of motion which reapproximates her discomfort.  My suspicion for atypical ACS is extremely low given the reproducibility of this pain.  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  Plan for short course of muscle relaxer but will advise follow-up with sports medicine for PT and other outpatient mgmt. Discussed ED return precautions.   ____________________________________________  FINAL CLINICAL IMPRESSION(S) / ED DIAGNOSES  Final diagnoses:  Acute pain of right shoulder    NEW OUTPATIENT MEDICATIONS STARTED DURING THIS VISIT:  Discharge Medication List as of 08/14/2018  5:05 PM    START taking these medications   Details  methocarbamol (ROBAXIN) 500 MG tablet Take 1 tablet (500 mg total) by mouth every 8 (eight) hours as needed for muscle spasms., Starting Tue 08/14/2018, Print        Note:  This document was prepared using Dragon voice recognition software and may include unintentional dictation errors.  Alona Bene, MD Emergency Medicine    , Arlyss Repress, MD 08/15/18 [phone]

## 2018-10-30 DIAGNOSIS — E119 Type 2 diabetes mellitus without complications: Secondary | ICD-10-CM | POA: Diagnosis not present

## 2018-10-30 DIAGNOSIS — J309 Allergic rhinitis, unspecified: Secondary | ICD-10-CM | POA: Diagnosis not present

## 2018-10-30 DIAGNOSIS — Z6829 Body mass index (BMI) 29.0-29.9, adult: Secondary | ICD-10-CM | POA: Diagnosis not present

## 2018-10-30 DIAGNOSIS — E663 Overweight: Secondary | ICD-10-CM | POA: Diagnosis not present

## 2018-10-30 DIAGNOSIS — J40 Bronchitis, not specified as acute or chronic: Secondary | ICD-10-CM | POA: Diagnosis not present

## 2018-10-30 DIAGNOSIS — L989 Disorder of the skin and subcutaneous tissue, unspecified: Secondary | ICD-10-CM | POA: Diagnosis not present

## 2019-02-19 DIAGNOSIS — F419 Anxiety disorder, unspecified: Secondary | ICD-10-CM | POA: Diagnosis not present

## 2019-02-19 DIAGNOSIS — N390 Urinary tract infection, site not specified: Secondary | ICD-10-CM | POA: Diagnosis not present

## 2019-02-19 DIAGNOSIS — Z1211 Encounter for screening for malignant neoplasm of colon: Secondary | ICD-10-CM | POA: Diagnosis not present

## 2019-03-08 DIAGNOSIS — Z9181 History of falling: Secondary | ICD-10-CM | POA: Diagnosis not present

## 2019-03-08 DIAGNOSIS — I10 Essential (primary) hypertension: Secondary | ICD-10-CM | POA: Diagnosis not present

## 2019-03-08 DIAGNOSIS — E663 Overweight: Secondary | ICD-10-CM | POA: Diagnosis not present

## 2019-03-08 DIAGNOSIS — E785 Hyperlipidemia, unspecified: Secondary | ICD-10-CM | POA: Diagnosis not present

## 2019-03-08 DIAGNOSIS — Z79899 Other long term (current) drug therapy: Secondary | ICD-10-CM | POA: Diagnosis not present

## 2019-03-08 DIAGNOSIS — Z6829 Body mass index (BMI) 29.0-29.9, adult: Secondary | ICD-10-CM | POA: Diagnosis not present

## 2019-03-08 DIAGNOSIS — E559 Vitamin D deficiency, unspecified: Secondary | ICD-10-CM | POA: Diagnosis not present

## 2019-03-08 DIAGNOSIS — E119 Type 2 diabetes mellitus without complications: Secondary | ICD-10-CM | POA: Diagnosis not present

## 2019-03-08 DIAGNOSIS — Z1331 Encounter for screening for depression: Secondary | ICD-10-CM | POA: Diagnosis not present

## 2019-06-06 DIAGNOSIS — F419 Anxiety disorder, unspecified: Secondary | ICD-10-CM | POA: Diagnosis not present

## 2019-06-06 DIAGNOSIS — Z79899 Other long term (current) drug therapy: Secondary | ICD-10-CM | POA: Diagnosis not present

## 2019-06-06 DIAGNOSIS — E785 Hyperlipidemia, unspecified: Secondary | ICD-10-CM | POA: Diagnosis not present

## 2019-06-06 DIAGNOSIS — E119 Type 2 diabetes mellitus without complications: Secondary | ICD-10-CM | POA: Diagnosis not present

## 2019-06-06 DIAGNOSIS — Z1231 Encounter for screening mammogram for malignant neoplasm of breast: Secondary | ICD-10-CM | POA: Diagnosis not present

## 2019-06-06 DIAGNOSIS — F329 Major depressive disorder, single episode, unspecified: Secondary | ICD-10-CM | POA: Diagnosis not present

## 2019-06-06 DIAGNOSIS — E559 Vitamin D deficiency, unspecified: Secondary | ICD-10-CM | POA: Diagnosis not present

## 2019-06-06 DIAGNOSIS — Z780 Asymptomatic menopausal state: Secondary | ICD-10-CM | POA: Diagnosis not present

## 2019-06-06 DIAGNOSIS — I10 Essential (primary) hypertension: Secondary | ICD-10-CM | POA: Diagnosis not present

## 2019-06-07 DIAGNOSIS — E119 Type 2 diabetes mellitus without complications: Secondary | ICD-10-CM | POA: Diagnosis not present

## 2019-06-07 DIAGNOSIS — Z79899 Other long term (current) drug therapy: Secondary | ICD-10-CM | POA: Diagnosis not present

## 2019-06-07 DIAGNOSIS — E559 Vitamin D deficiency, unspecified: Secondary | ICD-10-CM | POA: Diagnosis not present

## 2019-06-07 DIAGNOSIS — E785 Hyperlipidemia, unspecified: Secondary | ICD-10-CM | POA: Diagnosis not present

## 2019-06-10 DIAGNOSIS — E785 Hyperlipidemia, unspecified: Secondary | ICD-10-CM | POA: Diagnosis not present

## 2019-06-10 DIAGNOSIS — N390 Urinary tract infection, site not specified: Secondary | ICD-10-CM | POA: Diagnosis not present

## 2019-06-10 DIAGNOSIS — F419 Anxiety disorder, unspecified: Secondary | ICD-10-CM | POA: Diagnosis not present

## 2019-06-18 DIAGNOSIS — F329 Major depressive disorder, single episode, unspecified: Secondary | ICD-10-CM | POA: Diagnosis not present

## 2019-06-18 DIAGNOSIS — F419 Anxiety disorder, unspecified: Secondary | ICD-10-CM | POA: Diagnosis not present

## 2019-06-18 DIAGNOSIS — N390 Urinary tract infection, site not specified: Secondary | ICD-10-CM | POA: Diagnosis not present

## 2019-06-28 DIAGNOSIS — Z9181 History of falling: Secondary | ICD-10-CM | POA: Diagnosis not present

## 2019-06-28 DIAGNOSIS — Z0000 Encounter for general adult medical examination without abnormal findings: Secondary | ICD-10-CM | POA: Diagnosis not present

## 2019-06-28 DIAGNOSIS — E785 Hyperlipidemia, unspecified: Secondary | ICD-10-CM | POA: Diagnosis not present

## 2019-06-28 DIAGNOSIS — Z1331 Encounter for screening for depression: Secondary | ICD-10-CM | POA: Diagnosis not present

## 2019-07-09 DIAGNOSIS — H68003 Unspecified Eustachian salpingitis, bilateral: Secondary | ICD-10-CM | POA: Diagnosis not present

## 2019-07-09 DIAGNOSIS — F419 Anxiety disorder, unspecified: Secondary | ICD-10-CM | POA: Diagnosis not present

## 2019-07-09 DIAGNOSIS — Z23 Encounter for immunization: Secondary | ICD-10-CM | POA: Diagnosis not present

## 2019-07-09 DIAGNOSIS — Z6821 Body mass index (BMI) 21.0-21.9, adult: Secondary | ICD-10-CM | POA: Diagnosis not present

## 2019-07-09 DIAGNOSIS — R300 Dysuria: Secondary | ICD-10-CM | POA: Diagnosis not present

## 2019-09-06 DIAGNOSIS — Z1231 Encounter for screening mammogram for malignant neoplasm of breast: Secondary | ICD-10-CM | POA: Diagnosis not present

## 2019-09-06 DIAGNOSIS — Z780 Asymptomatic menopausal state: Secondary | ICD-10-CM | POA: Diagnosis not present

## 2019-09-06 DIAGNOSIS — Z13820 Encounter for screening for osteoporosis: Secondary | ICD-10-CM | POA: Diagnosis not present

## 2019-09-10 DIAGNOSIS — E119 Type 2 diabetes mellitus without complications: Secondary | ICD-10-CM | POA: Diagnosis not present

## 2019-09-10 DIAGNOSIS — F419 Anxiety disorder, unspecified: Secondary | ICD-10-CM | POA: Diagnosis not present

## 2019-09-10 DIAGNOSIS — E785 Hyperlipidemia, unspecified: Secondary | ICD-10-CM | POA: Diagnosis not present

## 2019-09-10 DIAGNOSIS — I10 Essential (primary) hypertension: Secondary | ICD-10-CM | POA: Diagnosis not present

## 2020-01-28 DIAGNOSIS — E559 Vitamin D deficiency, unspecified: Secondary | ICD-10-CM | POA: Diagnosis not present

## 2020-01-28 DIAGNOSIS — F419 Anxiety disorder, unspecified: Secondary | ICD-10-CM | POA: Diagnosis not present

## 2020-01-28 DIAGNOSIS — Z79899 Other long term (current) drug therapy: Secondary | ICD-10-CM | POA: Diagnosis not present

## 2020-01-28 DIAGNOSIS — I10 Essential (primary) hypertension: Secondary | ICD-10-CM | POA: Diagnosis not present

## 2020-01-28 DIAGNOSIS — E119 Type 2 diabetes mellitus without complications: Secondary | ICD-10-CM | POA: Diagnosis not present

## 2020-01-28 DIAGNOSIS — E785 Hyperlipidemia, unspecified: Secondary | ICD-10-CM | POA: Diagnosis not present

## 2020-01-29 DIAGNOSIS — E785 Hyperlipidemia, unspecified: Secondary | ICD-10-CM | POA: Diagnosis not present

## 2020-01-29 DIAGNOSIS — Z79899 Other long term (current) drug therapy: Secondary | ICD-10-CM | POA: Diagnosis not present

## 2020-01-29 DIAGNOSIS — E119 Type 2 diabetes mellitus without complications: Secondary | ICD-10-CM | POA: Diagnosis not present

## 2020-01-29 DIAGNOSIS — E559 Vitamin D deficiency, unspecified: Secondary | ICD-10-CM | POA: Diagnosis not present

## 2020-03-03 DIAGNOSIS — F329 Major depressive disorder, single episode, unspecified: Secondary | ICD-10-CM | POA: Diagnosis not present

## 2020-03-03 DIAGNOSIS — E119 Type 2 diabetes mellitus without complications: Secondary | ICD-10-CM | POA: Diagnosis not present

## 2020-03-03 DIAGNOSIS — E663 Overweight: Secondary | ICD-10-CM | POA: Diagnosis not present

## 2020-03-03 DIAGNOSIS — I10 Essential (primary) hypertension: Secondary | ICD-10-CM | POA: Diagnosis not present

## 2020-03-03 DIAGNOSIS — F419 Anxiety disorder, unspecified: Secondary | ICD-10-CM | POA: Diagnosis not present

## 2020-03-03 DIAGNOSIS — Z6832 Body mass index (BMI) 32.0-32.9, adult: Secondary | ICD-10-CM | POA: Diagnosis not present

## 2020-03-03 DIAGNOSIS — R002 Palpitations: Secondary | ICD-10-CM | POA: Diagnosis not present

## 2020-03-20 ENCOUNTER — Ambulatory Visit: Payer: Medicare HMO | Admitting: Cardiology

## 2020-04-21 ENCOUNTER — Other Ambulatory Visit: Payer: Self-pay

## 2020-04-21 DIAGNOSIS — Z6829 Body mass index (BMI) 29.0-29.9, adult: Secondary | ICD-10-CM | POA: Insufficient documentation

## 2020-04-21 DIAGNOSIS — I10 Essential (primary) hypertension: Secondary | ICD-10-CM | POA: Insufficient documentation

## 2020-04-21 DIAGNOSIS — E7800 Pure hypercholesterolemia, unspecified: Secondary | ICD-10-CM | POA: Insufficient documentation

## 2020-04-21 DIAGNOSIS — E785 Hyperlipidemia, unspecified: Secondary | ICD-10-CM | POA: Insufficient documentation

## 2020-04-21 DIAGNOSIS — E119 Type 2 diabetes mellitus without complications: Secondary | ICD-10-CM | POA: Insufficient documentation

## 2020-04-22 ENCOUNTER — Ambulatory Visit: Payer: Medicare HMO | Admitting: Cardiology

## 2020-04-22 DIAGNOSIS — R300 Dysuria: Secondary | ICD-10-CM | POA: Insufficient documentation

## 2020-04-22 DIAGNOSIS — Z87898 Personal history of other specified conditions: Secondary | ICD-10-CM | POA: Insufficient documentation

## 2020-04-22 DIAGNOSIS — R198 Other specified symptoms and signs involving the digestive system and abdomen: Secondary | ICD-10-CM | POA: Insufficient documentation

## 2020-04-22 DIAGNOSIS — H68003 Unspecified Eustachian salpingitis, bilateral: Secondary | ICD-10-CM | POA: Insufficient documentation

## 2020-04-22 DIAGNOSIS — J40 Bronchitis, not specified as acute or chronic: Secondary | ICD-10-CM | POA: Insufficient documentation

## 2020-04-22 DIAGNOSIS — E669 Obesity, unspecified: Secondary | ICD-10-CM | POA: Insufficient documentation

## 2020-04-22 DIAGNOSIS — N390 Urinary tract infection, site not specified: Secondary | ICD-10-CM | POA: Insufficient documentation

## 2020-04-22 DIAGNOSIS — J309 Allergic rhinitis, unspecified: Secondary | ICD-10-CM | POA: Insufficient documentation

## 2020-04-22 DIAGNOSIS — R197 Diarrhea, unspecified: Secondary | ICD-10-CM | POA: Insufficient documentation

## 2020-04-22 DIAGNOSIS — F39 Unspecified mood [affective] disorder: Secondary | ICD-10-CM | POA: Insufficient documentation

## 2020-04-22 DIAGNOSIS — M8588 Other specified disorders of bone density and structure, other site: Secondary | ICD-10-CM | POA: Insufficient documentation

## 2020-04-22 DIAGNOSIS — Z9181 History of falling: Secondary | ICD-10-CM | POA: Insufficient documentation

## 2020-04-22 DIAGNOSIS — F4321 Adjustment disorder with depressed mood: Secondary | ICD-10-CM | POA: Insufficient documentation

## 2020-04-22 DIAGNOSIS — H6123 Impacted cerumen, bilateral: Secondary | ICD-10-CM | POA: Insufficient documentation

## 2020-04-22 DIAGNOSIS — M19019 Primary osteoarthritis, unspecified shoulder: Secondary | ICD-10-CM | POA: Insufficient documentation

## 2020-04-22 DIAGNOSIS — M654 Radial styloid tenosynovitis [de Quervain]: Secondary | ICD-10-CM | POA: Insufficient documentation

## 2020-04-22 DIAGNOSIS — J45909 Unspecified asthma, uncomplicated: Secondary | ICD-10-CM | POA: Insufficient documentation

## 2020-04-22 DIAGNOSIS — E663 Overweight: Secondary | ICD-10-CM | POA: Insufficient documentation

## 2020-04-22 DIAGNOSIS — H6120 Impacted cerumen, unspecified ear: Secondary | ICD-10-CM | POA: Insufficient documentation

## 2020-04-22 DIAGNOSIS — N3281 Overactive bladder: Secondary | ICD-10-CM | POA: Insufficient documentation

## 2020-04-22 DIAGNOSIS — Z780 Asymptomatic menopausal state: Secondary | ICD-10-CM | POA: Insufficient documentation

## 2020-04-22 DIAGNOSIS — F419 Anxiety disorder, unspecified: Secondary | ICD-10-CM | POA: Insufficient documentation

## 2020-04-22 DIAGNOSIS — L989 Disorder of the skin and subcutaneous tissue, unspecified: Secondary | ICD-10-CM | POA: Insufficient documentation

## 2020-04-22 DIAGNOSIS — R739 Hyperglycemia, unspecified: Secondary | ICD-10-CM | POA: Insufficient documentation

## 2020-05-20 ENCOUNTER — Ambulatory Visit: Payer: Medicare HMO | Admitting: Cardiology

## 2020-06-30 ENCOUNTER — Ambulatory Visit: Payer: Medicare HMO | Admitting: Cardiology

## 2020-07-21 DIAGNOSIS — F419 Anxiety disorder, unspecified: Secondary | ICD-10-CM | POA: Diagnosis not present

## 2020-07-21 DIAGNOSIS — F32A Depression, unspecified: Secondary | ICD-10-CM | POA: Diagnosis not present

## 2020-07-21 DIAGNOSIS — E785 Hyperlipidemia, unspecified: Secondary | ICD-10-CM | POA: Diagnosis not present

## 2020-07-21 DIAGNOSIS — Z79899 Other long term (current) drug therapy: Secondary | ICD-10-CM | POA: Diagnosis not present

## 2020-07-21 DIAGNOSIS — R600 Localized edema: Secondary | ICD-10-CM | POA: Diagnosis not present

## 2020-07-21 DIAGNOSIS — E119 Type 2 diabetes mellitus without complications: Secondary | ICD-10-CM | POA: Diagnosis not present

## 2020-07-21 DIAGNOSIS — E559 Vitamin D deficiency, unspecified: Secondary | ICD-10-CM | POA: Diagnosis not present

## 2020-07-21 DIAGNOSIS — I10 Essential (primary) hypertension: Secondary | ICD-10-CM | POA: Diagnosis not present

## 2020-08-21 DIAGNOSIS — H68003 Unspecified Eustachian salpingitis, bilateral: Secondary | ICD-10-CM | POA: Diagnosis not present

## 2020-08-21 DIAGNOSIS — J019 Acute sinusitis, unspecified: Secondary | ICD-10-CM | POA: Diagnosis not present

## 2020-08-21 DIAGNOSIS — Z20822 Contact with and (suspected) exposure to covid-19: Secondary | ICD-10-CM | POA: Diagnosis not present

## 2020-08-25 DIAGNOSIS — Z0000 Encounter for general adult medical examination without abnormal findings: Secondary | ICD-10-CM | POA: Diagnosis not present

## 2020-08-25 DIAGNOSIS — Z9181 History of falling: Secondary | ICD-10-CM | POA: Diagnosis not present

## 2020-08-25 DIAGNOSIS — Z1331 Encounter for screening for depression: Secondary | ICD-10-CM | POA: Diagnosis not present

## 2020-08-25 DIAGNOSIS — E669 Obesity, unspecified: Secondary | ICD-10-CM | POA: Diagnosis not present

## 2020-08-25 DIAGNOSIS — Z139 Encounter for screening, unspecified: Secondary | ICD-10-CM | POA: Diagnosis not present

## 2020-08-25 DIAGNOSIS — E785 Hyperlipidemia, unspecified: Secondary | ICD-10-CM | POA: Diagnosis not present

## 2020-10-01 ENCOUNTER — Encounter (HOSPITAL_BASED_OUTPATIENT_CLINIC_OR_DEPARTMENT_OTHER): Payer: Self-pay | Admitting: *Deleted

## 2020-10-01 ENCOUNTER — Other Ambulatory Visit: Payer: Self-pay

## 2020-10-01 ENCOUNTER — Emergency Department (HOSPITAL_BASED_OUTPATIENT_CLINIC_OR_DEPARTMENT_OTHER)
Admission: EM | Admit: 2020-10-01 | Discharge: 2020-10-01 | Disposition: A | Discharge status: Home / Self Care | Payer: Medicare HMO | Attending: Emergency Medicine | Admitting: Emergency Medicine

## 2020-10-01 DIAGNOSIS — R000 Tachycardia, unspecified: Secondary | ICD-10-CM | POA: Diagnosis not present

## 2020-10-01 DIAGNOSIS — E1165 Type 2 diabetes mellitus with hyperglycemia: Secondary | ICD-10-CM | POA: Diagnosis not present

## 2020-10-01 DIAGNOSIS — I10 Essential (primary) hypertension: Secondary | ICD-10-CM | POA: Insufficient documentation

## 2020-10-01 DIAGNOSIS — F410 Panic disorder [episodic paroxysmal anxiety] without agoraphobia: Secondary | ICD-10-CM | POA: Diagnosis not present

## 2020-10-01 DIAGNOSIS — Z79899 Other long term (current) drug therapy: Secondary | ICD-10-CM | POA: Insufficient documentation

## 2020-10-01 DIAGNOSIS — R002 Palpitations: Secondary | ICD-10-CM | POA: Diagnosis not present

## 2020-10-01 NOTE — ED Provider Notes (Signed)
[facility] EMERGENCY DEPARTMENT Provider Note   CSN: [id number] Arrival date & time: 10/01/20  0244     History Chief Complaint  Patient presents with  . Tachycardia    Heather Woods is a 70 y.o. female.  Here with a self-described panic attack.  Patient states she suffers from panic disorder.  She had 3 panic attacks in December.  She states that tonight she was sleeping she woke up and felt the same symptoms.  She had palpitations feeling like she had died and became even more more anxious with some diaphoresis.  No lightheadedness or chest pain.  No recent changes in medications.  No alcohol, drugs or tobacco.  No other associated symptoms.  No fevers or        Past Medical History:  Diagnosis Date  . Adult BMI 29.0-29.9 kg/sq m   . Allergic bronchitis   . Allergic rhinitis 09/08/2017  . Allergic rhinosinusitis   . Anxiety   . Anxiety and depression   . At risk for falls   . Bronchitis   . Depression   . Diarrhea   . DM (diabetes mellitus), type 2 (HCC)   . Dyslipidemia 09/08/2017  . Dyspnea on exertion 09/08/2017  . Dysuria   . Essential hypertension 09/08/2017  . Eustachian catarrh, bilateral   . Excess ear wax   . Excessive ear wax, bilateral   . Grief   . High cholesterol   . History of chest pain   . Hypercholesteremia   . Hyperglycemia   . Hyperlipidemia   . Hypertension   . Irregular bowel habits   . Menopause   . Mood disorder (HCC)   . Obesity   . Other specified disorders of bone density and structure, other site   . Overactive bladder   . Overweight   . Palpitations 09/08/2017  . Panic attack   . Shoulder arthritis   . Skin lesion of face   . Tendinitis, de Quervain's   . Urinary tract infection   . Vitamin D deficiency   . Vitamin D deficiency     Patient Active Problem List   Diagnosis Date Noted  . Allergic bronchitis   . Allergic rhinosinusitis   . Anxiety and depression   . At risk for falls   . Bronchitis   .  Diarrhea   . Dysuria   . Eustachian catarrh, bilateral   . Excess ear wax   . Excessive ear wax, bilateral   . Grief   . History of chest pain   . Hyperglycemia   . Irregular bowel habits   . Menopause   . Mood disorder (HCC)   . Obesity   . Other specified disorders of bone density and structure, other site   . Overactive bladder   . Overweight   . Shoulder arthritis   . Skin lesion of face   . Tendinitis, de Quervain's   . Urinary tract infection   . Adult BMI 29.0-29.9 kg/sq m   . DM (diabetes mellitus), type 2 (HCC)   . High cholesterol   . Hypercholesteremia   . Hyperlipidemia   . Hypertension   . Vitamin D deficiency 09/08/2017  . Allergic rhinitis 09/08/2017  . Essential hypertension 09/08/2017  . Dyslipidemia 09/08/2017  . Dyspnea on exertion 09/08/2017  . Palpitations 09/08/2017  . Panic attack   . Depression   . Anxiety     Past Surgical History:  Procedure Laterality Date  . BREAST BIOPSY  2006  Pathology normal  . TONSILLECTOMY  1999  . TUBAL LIGATION  1983     OB History   No obstetric history on file.     Family History  Problem Relation Age of Onset  . Diabetes Mother   . Hypertension Father   . Hypertension Sister   . Hypertension Brother   . Diabetes Brother     Social History   Tobacco Use  . Smoking status: Never Smoker  . Smokeless tobacco: Never Used  Vaping Use  . Vaping Use: Never used  Substance Use Topics  . Alcohol use: No  . Drug use: No    Home Medications Prior to Admission medications   Medication Sig Start Date End Date Taking? Authorizing Provider  ALPRAZolam (XANAX) 0.25 MG tablet Take 0.25 mg by mouth daily as needed. 01/28/20   [provider]  atorvastatin (LIPITOR) 40 MG tablet Take 1 tablet by mouth daily. 08/24/17   [provider]  buPROPion (WELLBUTRIN XL) 300 MG 24 hr tablet Take 300 mg by mouth daily.    [provider]  Hydrocortisone-Aloe 0.5 % CREA  12/27/17   [provider]  losartan-hydrochlorothiazide (HYZAAR) 50-12.5 MG tablet Take 1 tablet by mouth daily.     [provider]  methocarbamol (ROBAXIN) 500 MG tablet Take 1 tablet (500 mg total) by mouth every 8 (eight) hours as needed for muscle spasms. 08/14/18   Long, Wonda Olds, MD  metoprolol tartrate (LOPRESSOR) 25 MG tablet  03/03/20   [provider]  montelukast (SINGULAIR) 10 MG tablet TK 1 T PO HS 11/28/17   [provider]  OZEMPIC, 0.25 OR 0.5 MG/DOSE, 2 MG/1.5ML SOPN  03/03/20   [provider]  sertraline (ZOLOFT) 100 MG tablet Take 100 mg by mouth daily.     [provider]  Vitamin D, Ergocalciferol, (DRISDOL) 50000 units CAPS capsule Take 1 capsule by mouth once a week. 08/28/17   [provider]    Allergies    Lisinopril and Penicillins  Review of Systems   Review of Systems  All other systems reviewed and are negative.   Physical Exam Updated Vital Signs BP 138/71 (BP Location: Right Arm)   Pulse 88   Temp 98.3 F (36.8 C) (Oral)   Resp 14   Ht 5\' 3"  (1.6 m)   Wt 72.6 kg   SpO2 99%   BMI 28.34 kg/m   Physical Exam Vitals and nursing note reviewed.  Constitutional:      Appearance: She is well-developed and well-nourished.  HENT:     Head: Normocephalic and atraumatic.     Mouth/Throat:     Mouth: Mucous membranes are moist.     Pharynx: Oropharynx is clear.  Eyes:     Pupils: Pupils are equal, round, and reactive to light.  Cardiovascular:     Rate and Rhythm: Normal rate and regular rhythm.  Pulmonary:     Effort: No respiratory distress.     Breath sounds: No stridor.  Abdominal:     General: Abdomen is flat. There is no distension.  Musculoskeletal:        General: No swelling or tenderness. Normal range of motion.     Cervical back: Normal range of motion.  Skin:    General: Skin is warm and dry.  Neurological:     General: No focal deficit present.     Mental Status: She is alert.     ED  Results / Procedures / Treatments  Labs (all labs ordered are listed, but only abnormal results are displayed) Labs Reviewed - No data to display  EKG None  Radiology No results found.  Procedures Procedures (including critical care time)  Medications Ordered in ED Medications - No data to display  ED Course  I have reviewed the triage vital signs and the nursing notes.  Pertinent labs & imaging results that were available during my care of the patient were reviewed by me and considered in my medical decision making (see chart for details).    MDM Rules/Calculators/A&P                          Exam is reassuring.  Patient is asymptomatic at this time.  The only possibility I do think it would be if she had some type of undiagnosed paroxysmal arrhythmia such as atrial fibrillation.  She is seen a cardiologist in the past but not for this.  Plan will be to have her follow-up with cardiology as an outpatient.  Continue taking Xanax as needed.  Low suspicion for any other emergent causes at this time.  Will return here for any worsening symptoms.  Final Clinical Impression(s) / ED Diagnoses Final diagnoses:  Palpitations    Rx / DC Orders ED Discharge Orders    None       Burnett Spray, Barbara Cower, MD 10/01/20 [phone]

## 2020-10-01 NOTE — ED Triage Notes (Addendum)
Pt reports 'heart racing' tonight. Pt does report hx of panic attacks-denies fingers tingling or SOB.  States that it does feel similar to her panic attacks.

## 2020-10-05 DIAGNOSIS — F32A Depression, unspecified: Secondary | ICD-10-CM | POA: Diagnosis not present

## 2020-10-05 DIAGNOSIS — R002 Palpitations: Secondary | ICD-10-CM | POA: Diagnosis not present

## 2020-10-05 DIAGNOSIS — Z09 Encounter for follow-up examination after completed treatment for conditions other than malignant neoplasm: Secondary | ICD-10-CM | POA: Diagnosis not present

## 2020-10-05 DIAGNOSIS — F419 Anxiety disorder, unspecified: Secondary | ICD-10-CM | POA: Diagnosis not present

## 2020-10-05 DIAGNOSIS — J309 Allergic rhinitis, unspecified: Secondary | ICD-10-CM | POA: Diagnosis not present

## 2020-10-05 DIAGNOSIS — R413 Other amnesia: Secondary | ICD-10-CM | POA: Diagnosis not present

## 2020-10-05 DIAGNOSIS — H68003 Unspecified Eustachian salpingitis, bilateral: Secondary | ICD-10-CM | POA: Diagnosis not present

## 2020-10-27 DIAGNOSIS — Z79899 Other long term (current) drug therapy: Secondary | ICD-10-CM | POA: Diagnosis not present

## 2020-10-27 DIAGNOSIS — R600 Localized edema: Secondary | ICD-10-CM | POA: Diagnosis not present

## 2020-10-27 DIAGNOSIS — F32A Depression, unspecified: Secondary | ICD-10-CM | POA: Diagnosis not present

## 2020-10-27 DIAGNOSIS — E559 Vitamin D deficiency, unspecified: Secondary | ICD-10-CM | POA: Diagnosis not present

## 2020-10-27 DIAGNOSIS — E785 Hyperlipidemia, unspecified: Secondary | ICD-10-CM | POA: Diagnosis not present

## 2020-10-27 DIAGNOSIS — F419 Anxiety disorder, unspecified: Secondary | ICD-10-CM | POA: Diagnosis not present

## 2020-10-27 DIAGNOSIS — I10 Essential (primary) hypertension: Secondary | ICD-10-CM | POA: Diagnosis not present

## 2020-10-27 DIAGNOSIS — E119 Type 2 diabetes mellitus without complications: Secondary | ICD-10-CM | POA: Diagnosis not present

## 2020-10-27 DIAGNOSIS — Z6832 Body mass index (BMI) 32.0-32.9, adult: Secondary | ICD-10-CM | POA: Diagnosis not present

## 2020-11-09 ENCOUNTER — Other Ambulatory Visit: Payer: Self-pay

## 2020-11-10 ENCOUNTER — Encounter: Payer: Self-pay | Admitting: Cardiology

## 2020-11-10 ENCOUNTER — Other Ambulatory Visit: Payer: Self-pay

## 2020-11-10 ENCOUNTER — Ambulatory Visit: Payer: Medicare HMO | Admitting: Cardiology

## 2020-11-10 ENCOUNTER — Ambulatory Visit (INDEPENDENT_AMBULATORY_CARE_PROVIDER_SITE_OTHER): Payer: Medicare HMO

## 2020-11-10 VITALS — BP 140/80 | HR 70 | Ht 64.0 in | Wt 163.1 lb

## 2020-11-10 DIAGNOSIS — R002 Palpitations: Secondary | ICD-10-CM

## 2020-11-10 DIAGNOSIS — I10 Essential (primary) hypertension: Secondary | ICD-10-CM

## 2020-11-10 DIAGNOSIS — E782 Mixed hyperlipidemia: Secondary | ICD-10-CM

## 2020-11-10 NOTE — Patient Instructions (Signed)
Medication Instructions:  No medication changes. *If you need a refill on your cardiac medications before your next appointment, please call your pharmacy*   Lab Work: None ordered If you have labs (blood work) drawn today and your tests are completely normal, you will receive your results only by: Marland Kitchen MyChart Message (if you have MyChart) OR . A paper copy in the mail If you have any lab test that is abnormal or we need to change your treatment, we will call you to review the results.   Testing/Procedures:  WHY IS MY DOCTOR PRESCRIBING ZIO? The Zio system is proven and trusted by physicians to detect and diagnose irregular heart rhythms - and has been prescribed to hundreds of thousands of patients.  The FDA has cleared the Zio system to monitor for many different kinds of irregular heart rhythms. In a study, physicians were able to reach a diagnosis 90% of the time with the Zio system1.  You can wear the Zio monitor - a small, discreet, comfortable patch - during your normal day-to-day activity, including while you sleep, shower, and exercise, while it records every single heartbeat for analysis.  1Barrett, P., et al. Comparison of 24 Hour Holter Monitoring Versus 14 Day Novel Adhesive Patch Electrocardiographic Monitoring. American Journal of Medicine, 2014.  ZIO VS. HOLTER MONITORING The Zio monitor can be comfortably worn for up to 14 days. Holter monitors can be worn for 24 to 48 hours, limiting the time to record any irregular heart rhythms you may have. Zio is able to capture data for the 51% of patients who have their first symptom-triggered arrhythmia after 48 hours.1  LIVE WITHOUT RESTRICTIONS The Zio ambulatory cardiac monitor is a small, unobtrusive, and water-resistant patch-you might even forget you're wearing it. The Zio monitor records and stores every beat of your heart, whether you're sleeping, working out, or showering.  Wear the monitor for 1 week, remove  11/17/20.  We will order CT coronary calcium score. It will cost $99.00 and is not covered by insurance.  Please call [phone] to schedule.   [facility]  [address]  [place], [place] [address]    Follow-Up: At [facility], you and your health needs are our priority.  As part of our continuing mission to provide you with exceptional heart care, we have created designated Provider Care Teams.  These Care Teams include your primary Cardiologist (physician) and Advanced Practice Providers (APPs -  Physician Assistants and Nurse Practitioners) who all work together to provide you with the care you need, when you need it.  We recommend signing up for the patient portal called "MyChart".  Sign up information is provided on this After Visit Summary.  MyChart is used to connect with patients for Virtual Visits (Telemedicine).  Patients are able to view lab/test results, encounter notes, upcoming appointments, etc.  Non-urgent messages can be sent to your provider as well.   To learn more about what you can do with MyChart, go to [email or web address].    Your next appointment:   1 month(s)  The format for your next appointment:   In Person  Provider:   Belva Crome, MD   Other Instructions  Coronary Calcium Scan A coronary calcium scan is an imaging test used to look for deposits of plaque in the inner lining of the blood vessels of the heart (coronary arteries). Plaque is made up of calcium, protein, and fatty substances. These deposits of plaque can partly clog and narrow the coronary  arteries without producing any symptoms or warning signs. This puts a person at risk for a heart attack. This test is recommended for people who are at moderate risk for heart disease. The test can find plaque deposits before symptoms develop. Tell a health care provider about:  Any allergies you have.  All medicines you are taking, including vitamins, herbs, eye drops, creams,  and over-the-counter medicines.  Any problems you or family members have had with anesthetic medicines.  Any blood disorders you have.  Any surgeries you have had.  Any medical conditions you have.  Whether you are pregnant or may be pregnant. What are the risks? Generally, this is a safe procedure. However, problems may occur, including:  Harm to a pregnant woman and her unborn baby. This test involves the use of radiation. Radiation exposure can be dangerous to a pregnant woman and her unborn baby. If you are pregnant or think you may be pregnant, you should not have this procedure done.  Slight increase in the risk of cancer. This is because of the radiation involved in the test. What happens before the procedure? Ask your health care provider for any specific instructions on how to prepare for this procedure. You may be asked to avoid products that contain caffeine, tobacco, or nicotine for 4 hours before the procedure. What happens during the procedure?  You will undress and remove any jewelry from your neck or chest.  You will put on a hospital gown.  Sticky electrodes will be placed on your chest. The electrodes will be connected to an electrocardiogram (ECG) machine to record a tracing of the electrical activity of your heart.  You will lie down on a curved bed that is attached to the CT scanner.  You may be given medicine to slow down your heart rate so that clear pictures can be created.  You will be moved into the CT scanner, and the CT scanner will take pictures of your heart. During this time, you will be asked to lie still and hold your breath for 2-3 seconds at a time while each picture of your heart is being taken. The procedure may vary among health care providers and hospitals.   What happens after the procedure?  You can get dressed.  You can return to your normal activities.  It is up to you to get the results of your procedure. Ask your health care  provider, or the department that is doing the procedure, when your results will be ready. Summary  A coronary calcium scan is an imaging test used to look for deposits of plaque in the inner lining of the blood vessels of the heart (coronary arteries). Plaque is made up of calcium, protein, and fatty substances.  Generally, this is a safe procedure. Tell your health care provider if you are pregnant or may be pregnant.  Ask your health care provider for any specific instructions on how to prepare for this procedure.  A CT scanner will take pictures of your heart.  You can return to your normal activities after the scan is done. This information is not intended to replace advice given to you by your health care provider. Make sure you discuss any questions you have with your health care provider. Document Revised: 04/02/2019 Document Reviewed: 04/02/2019 Elsevier Patient Education  2021 [employer].

## 2020-11-10 NOTE — Progress Notes (Signed)
Cardiology Office Note:    Date:  11/10/2020   [name], DOB 24-Jul-1951, MRN [id number]  PCP:  Lucianne Lei, MD  Cardiologist:  Garwin Brothers, MD   Referring MD: Lucianne Lei, MD    ASSESSMENT:    1. Essential hypertension   2. Mixed hyperlipidemia   3. Palpitations    PLAN:    In order of problems listed above:  1. Primary prevention stressed with the patient.  Importance of compliance with diet medication stressed and she vocalized understanding. 2. Essential hypertension: Blood pressure stable and diet was emphasized.  Lifestyle modification was urged.  Her blood pressure is borderline so I am not going to initiate ACE inhibitor or ARB.  She has diet-controlled diabetes mellitus it appears. 3. Mixed hyperlipidemia: Diet was emphasized.  Lipids were reviewed.  Weight reduction was stressed.  She was advised to walk at least half an hour a day 5 days a week and she promises to do so. 4. Elevated hemoglobin A1c: I discussed diet at extensive length and she promises to do better.  Weight reduction was stressed. 5. Coronary risk stratification: She is agreeable for calcium scoring CT scan and I will order one for her. 6. Patient will be seen in follow-up appointment in 1 month or earlier if the patient has any concerns    Medication Adjustments/Labs and Tests Ordered: Current medicines are reviewed at length with the patient today.  Concerns regarding medicines are outlined above.  No orders of the defined types were placed in this encounter.  No orders of the defined types were placed in this encounter.    No chief complaint on file.    History of Present Illness:    Heather Woods is a 70 y.o. female.  Patient has past medical history of essential hypertension dyslipidemia and elevated hemoglobin A1c.  She leads a sedentary lifestyle.  She mentions to me that she is having palpitations and skipped beat sensation.  This happens on a regular basis.  No chest  pain orthopnea or PND.  She is very concerned about it.  At the time of my evaluation, the patient is alert awake oriented and in no distress.  Past Medical History:  Diagnosis Date  . Adult BMI 29.0-29.9 kg/sq m   . Allergic bronchitis   . Allergic rhinitis 09/08/2017  . Allergic rhinosinusitis   . Anxiety   . Anxiety and depression   . At risk for falls   . Bronchitis   . Depression   . Diarrhea   . DM (diabetes mellitus), type 2 (HCC)   . Dyslipidemia 09/08/2017  . Dyspnea on exertion 09/08/2017  . Dysuria   . Essential hypertension 09/08/2017  . Eustachian catarrh, bilateral   . Excess ear wax   . Excessive ear wax, bilateral   . Grief   . High cholesterol   . History of chest pain   . Hypercholesteremia   . Hyperglycemia   . Hyperlipidemia   . Hypertension   . Irregular bowel habits   . Menopause   . Mood disorder (HCC)   . Obesity   . Other specified disorders of bone density and structure, other site   . Overactive bladder   . Overweight   . Palpitations 09/08/2017  . Panic attack   . Shoulder arthritis   . Skin lesion of face   . Tendinitis, de Quervain's   . Urinary tract infection   . Vitamin D deficiency   . Vitamin D deficiency  Past Surgical History:  Procedure Laterality Date  . BREAST BIOPSY  2006   Pathology normal  . TONSILLECTOMY  1999  . TUBAL LIGATION  1983    Current Medications: Current Meds  Medication Sig  . ALPRAZolam (XANAX) 0.25 MG tablet Take 0.25 mg by mouth daily as needed for anxiety.  Marland Kitchen atorvastatin (LIPITOR) 40 MG tablet Take 1 tablet by mouth daily.  Marland Kitchen losartan-hydrochlorothiazide (HYZAAR) 50-12.5 MG tablet Take 1 tablet by mouth daily.   . montelukast (SINGULAIR) 10 MG tablet Take 10 mg by mouth as needed (allergies).  . potassium chloride (KLOR-CON) 10 MEQ tablet Take 10 mEq by mouth daily.  . sertraline (ZOLOFT) 100 MG tablet Take 100 mg by mouth daily.      Allergies:   Lisinopril and Penicillins   Social  History   Socioeconomic History  . Marital status: Divorced    Spouse name: Not on file  . Number of children: Not on file  . Years of education: Not on file  . Highest education level: Not on file  Occupational History  . Not on file  Tobacco Use  . Smoking status: Never Smoker  . Smokeless tobacco: Never Used  Vaping Use  . Vaping Use: Never used  Substance and Sexual Activity  . Alcohol use: No  . Drug use: No  . Sexual activity: Yes    Birth control/protection: Post-menopausal  Other Topics Concern  . Not on file  Social History Narrative  . Not on file   Social Determinants of Health   Financial Resource Strain: Not on file  Food Insecurity: Not on file  Transportation Needs: Not on file  Physical Activity: Not on file  Stress: Not on file  Social Connections: Not on file     Family History: The patient's family history includes Diabetes in her brother and mother; Hypertension in her brother, father, and sister.  ROS:   Please see the history of present illness.    All other systems reviewed and are negative.  EKGs/Labs/Other Studies Reviewed:    The following studies were reviewed today: EKG reveals sinus rhythm and nonspecific ST-T changes   Recent Labs: No results found for requested labs within last 8760 hours.  Recent Lipid Panel No results found for: CHOL, TRIG, HDL, CHOLHDL, VLDL, LDLCALC, LDLDIRECT  Physical Exam:    VS:  BP 140/80   Pulse 70   Ht 5\' 4"  (1.626 m)   Wt 163 lb 1.3 oz (74 kg)   SpO2 98%   BMI 27.99 kg/m     Wt Readings from Last 3 Encounters:  11/10/20 163 lb 1.3 oz (74 kg)  10/01/20 160 lb (72.6 kg)  08/14/18 150 lb (68 kg)     GEN: Patient is in no acute distress HEENT: Normal NECK: No JVD; No carotid bruits LYMPHATICS: No lymphadenopathy CARDIAC: Hear sounds regular, 2/6 systolic murmur at the apex. RESPIRATORY:  Clear to auscultation without rales, wheezing or rhonchi  ABDOMEN: Soft, non-tender,  non-distended MUSCULOSKELETAL:  No edema; No deformity  SKIN: Warm and dry NEUROLOGIC:  Alert and oriented x 3 PSYCHIATRIC:  Normal affect   Signed, 08/16/18, MD  11/10/2020 8:39 AM    [facility]

## 2020-11-17 DIAGNOSIS — R002 Palpitations: Secondary | ICD-10-CM | POA: Diagnosis not present

## 2020-11-23 DIAGNOSIS — R002 Palpitations: Secondary | ICD-10-CM | POA: Diagnosis not present

## 2020-12-02 ENCOUNTER — Ambulatory Visit (INDEPENDENT_AMBULATORY_CARE_PROVIDER_SITE_OTHER)
Admission: RE | Admit: 2020-12-02 | Discharge: 2020-12-02 | Disposition: A | Discharge status: Home / Self Care | Payer: Self-pay | Source: Ambulatory Visit | Attending: Cardiology | Admitting: Cardiology

## 2020-12-02 ENCOUNTER — Other Ambulatory Visit: Payer: Self-pay

## 2020-12-02 DIAGNOSIS — E782 Mixed hyperlipidemia: Secondary | ICD-10-CM

## 2020-12-02 DIAGNOSIS — I10 Essential (primary) hypertension: Secondary | ICD-10-CM

## 2020-12-02 DIAGNOSIS — R002 Palpitations: Secondary | ICD-10-CM

## 2020-12-04 DIAGNOSIS — E782 Mixed hyperlipidemia: Secondary | ICD-10-CM | POA: Diagnosis not present

## 2020-12-04 NOTE — Addendum Note (Signed)
Addended by: Eleonore Chiquito on: 12/04/2020 04:44 PM   Modules accepted: Orders

## 2020-12-05 LAB — LIPID PANEL
Chol/HDL Ratio: 4.1 ratio (ref 0.0–4.4)
Cholesterol, Total: 191 mg/dL (ref 100–199)
HDL: 47 mg/dL (ref >39)
LDL Chol Calc (NIH): 126 mg/dL — ABNORMAL HIGH (ref 0–99)
Triglycerides: 99 mg/dL (ref 0–149)
VLDL Cholesterol Cal: 18 mg/dL (ref 5–40)

## 2020-12-05 LAB — BASIC METABOLIC PANEL
BUN/Creatinine Ratio: 17 (ref 12–28)
BUN: 12 mg/dL (ref 8–27)
CO2: 24 mmol/L (ref 20–29)
Calcium: 9.7 mg/dL (ref 8.7–10.3)
Chloride: 104 mmol/L (ref 96–106)
Creatinine, Ser: 0.7 mg/dL (ref 0.57–1.00)
Glucose: 91 mg/dL (ref 65–99)
Potassium: 3.7 mmol/L (ref 3.5–5.2)
Sodium: 143 mmol/L (ref 134–144)
eGFR: 94 mL/min/{1.73_m2} (ref >59)

## 2020-12-05 LAB — HEPATIC FUNCTION PANEL
ALT: 16 IU/L (ref 0–32)
AST: 16 IU/L (ref 0–40)
Albumin: 4 g/dL (ref 3.8–4.8)
Alkaline Phosphatase: 51 IU/L (ref 44–121)
Bilirubin Total: 0.5 mg/dL (ref 0.0–1.2)
Bilirubin, Direct: 0.14 mg/dL (ref 0.00–0.40)
Total Protein: 6.8 g/dL (ref 6.0–8.5)

## 2020-12-07 MED ORDER — ATORVASTATIN CALCIUM 80 MG PO TABS: 80.0000 mg | ORAL_TABLET | Freq: Every day | ORAL | 3 refills | Status: DC

## 2020-12-07 NOTE — Addendum Note (Signed)
Addended by: Eleonore Chiquito on: 12/07/2020 03:01 PM   Modules accepted: Orders

## 2020-12-09 ENCOUNTER — Other Ambulatory Visit: Payer: Self-pay

## 2020-12-11 ENCOUNTER — Other Ambulatory Visit: Payer: Self-pay

## 2020-12-11 ENCOUNTER — Ambulatory Visit: Payer: Medicare HMO | Admitting: Cardiology

## 2020-12-11 ENCOUNTER — Encounter: Payer: Self-pay | Admitting: Cardiology

## 2020-12-11 VITALS — BP 142/78 | HR 62 | Ht 64.0 in | Wt 163.0 lb

## 2020-12-11 DIAGNOSIS — E782 Mixed hyperlipidemia: Secondary | ICD-10-CM

## 2020-12-11 DIAGNOSIS — I10 Essential (primary) hypertension: Secondary | ICD-10-CM

## 2020-12-11 DIAGNOSIS — E119 Type 2 diabetes mellitus without complications: Secondary | ICD-10-CM | POA: Insufficient documentation

## 2020-12-11 HISTORY — DX: Type 2 diabetes mellitus without complications: E11.9

## 2020-12-11 NOTE — Patient Instructions (Signed)

## 2020-12-11 NOTE — Progress Notes (Signed)
Cardiology Office Note:    Date:  12/11/2020   [name], DOB 09-05-1951, MRN [id number]  PCP:  Lucianne Lei, MD  Cardiologist:  Garwin Brothers, MD   Referring MD: Lucianne Lei, MD    ASSESSMENT:    1. Essential hypertension   2. Mixed hyperlipidemia   3. Diet-controlled diabetes mellitus (HCC)    PLAN:    In order of problems listed above:  1. Primary prevention stressed with the patient.  Importance of compliance with diet medication stressed and she vocalized understanding. 2. Essential hypertension: Controlled well with lifestyle modification and she is happy about it.  I told her to keep a track of it.  Lifestyle modification was urged I also told her to walk half an hour a day 5 times a week and she promises to do so.  Weight reduction was stressed. 3. Mixed dyslipidemia: Diet was emphasized.  Lipids were reviewed and she is happy with it. 4. Diet-controlled diabetes mellitus: Hemoglobin A1c is elevated and she is going to see a primary care doctor to discuss this.  Diet was emphasized. 5. Patient will be seen in follow-up appointment in 6 months or earlier if the patient has any concerns    Medication Adjustments/Labs and Tests Ordered: Current medicines are reviewed at length with the patient today.  Concerns regarding medicines are outlined above.  No orders of the defined types were placed in this encounter.  No orders of the defined types were placed in this encounter.    No chief complaint on file.    History of Present Illness:    Heather Woods is a 70 y.o. female.  Patient has past medical history of essential hypertension.  Currently the patient is doing fine.  She is not on any blood pressure medication because she has got her blood pressure controlled with lifestyle modification and salt intake appropriation.  She denies any chest pain orthopnea or PND.  At the time of my evaluation, the patient is alert awake oriented and in no  distress.  Past Medical History:  Diagnosis Date  . Adult BMI 29.0-29.9 kg/sq m   . Allergic bronchitis   . Allergic rhinitis 09/08/2017  . Allergic rhinosinusitis   . Anxiety   . Anxiety and depression   . At risk for falls   . Bronchitis   . Depression   . Diarrhea   . DM (diabetes mellitus), type 2 (HCC)   . Dyslipidemia 09/08/2017  . Dyspnea on exertion 09/08/2017  . Dysuria   . Essential hypertension 09/08/2017  . Eustachian catarrh, bilateral   . Excess ear wax   . Excessive ear wax, bilateral   . Grief   . High cholesterol   . History of chest pain   . Hypercholesteremia   . Hyperglycemia   . Hyperlipidemia   . Hypertension   . Irregular bowel habits   . Menopause   . Mood disorder (HCC)   . Obesity   . Other specified disorders of bone density and structure, other site   . Overactive bladder   . Overweight   . Palpitations 09/08/2017  . Panic attack   . Shoulder arthritis   . Skin lesion of face   . Tendinitis, de Quervain's   . Urinary tract infection   . Vitamin D deficiency   . Vitamin D deficiency     Past Surgical History:  Procedure Laterality Date  . BREAST BIOPSY  2006   Pathology normal  . TONSILLECTOMY  1999  .  TUBAL LIGATION  1983    Current Medications: Current Meds  Medication Sig  . ALPRAZolam (XANAX) 0.25 MG tablet Take 0.25 mg by mouth daily as needed for anxiety.  Marland Kitchen atorvastatin (LIPITOR) 80 MG tablet Take 1 tablet (80 mg total) by mouth daily.  . fluticasone (FLONASE) 50 MCG/ACT nasal spray Place 2 sprays into both nostrils as needed for rhinitis.  Marland Kitchen losartan-hydrochlorothiazide (HYZAAR) 50-12.5 MG tablet Take 1 tablet by mouth daily.   . montelukast (SINGULAIR) 10 MG tablet Take 10 mg by mouth as needed (allergies).  . potassium chloride (KLOR-CON) 10 MEQ tablet Take 10 mEq by mouth daily.  . sertraline (ZOLOFT) 100 MG tablet Take 100 mg by mouth daily.      Allergies:   Lisinopril and Penicillins   Social History    Socioeconomic History  . Marital status: Divorced    Spouse name: Not on file  . Number of children: Not on file  . Years of education: Not on file  . Highest education level: Not on file  Occupational History  . Not on file  Tobacco Use  . Smoking status: Never Smoker  . Smokeless tobacco: Never Used  Vaping Use  . Vaping Use: Never used  Substance and Sexual Activity  . Alcohol use: No  . Drug use: No  . Sexual activity: Yes    Birth control/protection: Post-menopausal  Other Topics Concern  . Not on file  Social History Narrative  . Not on file   Social Determinants of Health   Financial Resource Strain: Not on file  Food Insecurity: Not on file  Transportation Needs: Not on file  Physical Activity: Not on file  Stress: Not on file  Social Connections: Not on file     Family History: The patient's family history includes Diabetes in her brother and mother; Hypertension in her brother, father, and sister.  ROS:   Please see the history of present illness.    All other systems reviewed and are negative.  EKGs/Labs/Other Studies Reviewed:    The following studies were reviewed today: CLINICAL DATA:  Risk stratification  EXAM: Coronary Calcium Score  TECHNIQUE: The patient was scanned on a [employer] scanner. Axial non-contrast 3 mm slices were carried out through the heart. The data set was analyzed on a dedicated work station and scored using the Agatson method.  FINDINGS: Non-cardiac: See separate report from [facility].  Ascending Aorta: Mild calcification, normal sized.  Pericardium: Normal  Coronary arteries: Normal origin.  IMPRESSION: Coronary calcium score of 0 . This was 0 percentile for age and sex matched control.  Kardie Tobb,DO   Electronically Signed   By: Thomasene Ripple DO   On: 12/02/2020 18:16   Study Highlights   Patch Wear Time:  7 days and 3 hours (2022-02-15T08:54:37-0500 to  2022-02-22T12:29:07-499)  Patient had a min HR of 46 bpm, max HR of 144 bpm, and avg HR of 72 bpm. Predominant underlying rhythm was Sinus Rhythm. Bundle Branch Block/IVCD was present. 2 Supraventricular Tachycardia runs occurred, the run with the fastest interval lasting 7 beats  with a max rate of 144 bpm (avg 122 bpm); the run with the fastest interval was also the longest. Isolated SVEs were rare (<1.0%), SVE Triplets were rare (<1.0%), and no SVE Couplets were present. Isolated VEs were rare (<1.0%, 34), VE Triplets were  rare (<1.0%, 1), and no VE Couplets were present. Ventricular Bigeminy was present.  Impression: Mildly abnormal but overall unremarkable study.  Recent Labs: 12/04/2020: ALT 16; BUN 12; Creatinine, Ser 0.70; Potassium 3.7; Sodium 143  Recent Lipid Panel    Component Value Date/Time   CHOL 191 12/04/2020 1649   TRIG 99 12/04/2020 1649   HDL 47 12/04/2020 1649   CHOLHDL 4.1 12/04/2020 1649   LDLCALC 126 (H) 12/04/2020 1649    Physical Exam:    VS:  BP (!) 142/78   Pulse 62   Ht 5\' 4"  (1.626 m)   Wt 163 lb (73.9 kg)   SpO2 96%   BMI 27.98 kg/m     Wt Readings from Last 3 Encounters:  12/11/20 163 lb (73.9 kg)  11/10/20 163 lb 1.3 oz (74 kg)  10/01/20 160 lb (72.6 kg)     GEN: Patient is in no acute distress HEENT: Normal NECK: No JVD; No carotid bruits LYMPHATICS: No lymphadenopathy CARDIAC: Hear sounds regular, 2/6 systolic murmur at the apex. RESPIRATORY:  Clear to auscultation without rales, wheezing or rhonchi  ABDOMEN: Soft, non-tender, non-distended MUSCULOSKELETAL:  No edema; No deformity  SKIN: Warm and dry NEUROLOGIC:  Alert and oriented x 3 PSYCHIATRIC:  Normal affect   Signed, 11/29/20, MD  12/11/2020 11:55 AM    [facility]

## 2021-02-04 DIAGNOSIS — F419 Anxiety disorder, unspecified: Secondary | ICD-10-CM | POA: Diagnosis not present

## 2021-02-14 ENCOUNTER — Other Ambulatory Visit: Payer: Self-pay

## 2021-02-14 ENCOUNTER — Emergency Department (HOSPITAL_BASED_OUTPATIENT_CLINIC_OR_DEPARTMENT_OTHER)
Admission: EM | Admit: 2021-02-14 | Discharge: 2021-02-14 | Disposition: A | Discharge status: Home / Self Care | Payer: Medicare HMO | Attending: Emergency Medicine | Admitting: Emergency Medicine

## 2021-02-14 ENCOUNTER — Encounter (HOSPITAL_BASED_OUTPATIENT_CLINIC_OR_DEPARTMENT_OTHER): Payer: Self-pay | Admitting: *Deleted

## 2021-02-14 DIAGNOSIS — W57XXXA Bitten or stung by nonvenomous insect and other nonvenomous arthropods, initial encounter: Secondary | ICD-10-CM | POA: Insufficient documentation

## 2021-02-14 DIAGNOSIS — I10 Essential (primary) hypertension: Secondary | ICD-10-CM | POA: Insufficient documentation

## 2021-02-14 DIAGNOSIS — Z79899 Other long term (current) drug therapy: Secondary | ICD-10-CM | POA: Diagnosis not present

## 2021-02-14 DIAGNOSIS — E119 Type 2 diabetes mellitus without complications: Secondary | ICD-10-CM | POA: Insufficient documentation

## 2021-02-14 DIAGNOSIS — S1096XA Insect bite of unspecified part of neck, initial encounter: Secondary | ICD-10-CM | POA: Insufficient documentation

## 2021-02-14 NOTE — ED Notes (Signed)
Pt discharged to home. Discharge instructions have been discussed with patient and/or family members. Pt verbally acknowledges understanding d/c instructions, and endorses comprehension to checkout at registration before leaving.  °

## 2021-02-14 NOTE — ED Provider Notes (Signed)
[facility] EMERGENCY DEPARTMENT Provider Note   CSN: [id number] Arrival date & time: 02/14/21  1131     History Chief Complaint  Patient presents with  . Insect Bite    Heather Woods is a 70 y.o. female.  She is here with complaint of left neck pain after being bitten by something this morning.  She thinks it was a spider.  She said it was black.  She said it was itching but that is gotten better.  No chest pain shortness of breath fevers or chills.  She has tried nothing for it.  The history is provided by the patient.  Animal Bite Contact animal:  Insect Location:  Head/neck Head/neck injury location:  L neck Pain details:    Quality:  Itching   Severity:  No pain   Timing:  Constant   Progression:  Improving Incident location:  Home Relieved by:  None tried Worsened by:  Nothing Ineffective treatments:  None tried Associated symptoms: rash and swelling   Associated symptoms: no fever and no numbness        Past Medical History:  Diagnosis Date  . Adult BMI 29.0-29.9 kg/sq m   . Allergic bronchitis   . Allergic rhinitis 09/08/2017  . Allergic rhinosinusitis   . Anxiety   . Anxiety and depression   . At risk for falls   . Bronchitis   . Depression   . Diarrhea   . DM (diabetes mellitus), type 2 (HCC)   . Dyslipidemia 09/08/2017  . Dyspnea on exertion 09/08/2017  . Dysuria   . Essential hypertension 09/08/2017  . Eustachian catarrh, bilateral   . Excess ear wax   . Excessive ear wax, bilateral   . Grief   . High cholesterol   . History of chest pain   . Hypercholesteremia   . Hyperglycemia   . Hyperlipidemia   . Hypertension   . Irregular bowel habits   . Menopause   . Mood disorder (HCC)   . Obesity   . Other specified disorders of bone density and structure, other site   . Overactive bladder   . Overweight   . Palpitations 09/08/2017  . Panic attack   . Shoulder arthritis   . Skin lesion of face   . Tendinitis, de Quervain's    . Urinary tract infection   . Vitamin D deficiency   . Vitamin D deficiency     Patient Active Problem List   Diagnosis Date Noted  . Diet-controlled diabetes mellitus (HCC) 12/11/2020  . Allergic bronchitis   . Allergic rhinosinusitis   . Anxiety and depression   . At risk for falls   . Bronchitis   . Diarrhea   . Dysuria   . Eustachian catarrh, bilateral   . Excess ear wax   . Excessive ear wax, bilateral   . Grief   . History of chest pain   . Hyperglycemia   . Irregular bowel habits   . Menopause   . Mood disorder (HCC)   . Obesity   . Other specified disorders of bone density and structure, other site   . Overactive bladder   . Overweight   . Shoulder arthritis   . Skin lesion of face   . Tendinitis, de Quervain's   . Urinary tract infection   . Adult BMI 29.0-29.9 kg/sq m   . DM (diabetes mellitus), type 2 (HCC)   . High cholesterol   . Hypercholesteremia   . Hyperlipidemia   . Hypertension   .  Vitamin D deficiency 09/08/2017  . Allergic rhinitis 09/08/2017  . Essential hypertension 09/08/2017  . Dyslipidemia 09/08/2017  . Dyspnea on exertion 09/08/2017  . Palpitations 09/08/2017  . Panic attack   . Depression   . Anxiety     Past Surgical History:  Procedure Laterality Date  . BREAST BIOPSY  2006   Pathology normal  . TONSILLECTOMY  1999  . TUBAL LIGATION  1983     OB History   No obstetric history on file.     Family History  Problem Relation Age of Onset  . Diabetes Mother   . Hypertension Father   . Hypertension Sister   . Hypertension Brother   . Diabetes Brother     Social History   Tobacco Use  . Smoking status: Never Smoker  . Smokeless tobacco: Never Used  Vaping Use  . Vaping Use: Never used  Substance Use Topics  . Alcohol use: No  . Drug use: No    Home Medications Prior to Admission medications   Medication Sig Start Date End Date Taking? Authorizing Provider  ALPRAZolam Prudy Feeler) 0.25 MG tablet Take 0.25 mg by  mouth daily as needed for anxiety. 01/28/20   [provider]  atorvastatin (LIPITOR) 80 MG tablet Take 1 tablet (80 mg total) by mouth daily. 12/07/20   Revankar, Aundra Dubin, MD  fluticasone (FLONASE) 50 MCG/ACT nasal spray Place 2 sprays into both nostrils as needed for rhinitis. 10/05/20   [provider]  losartan-hydrochlorothiazide (HYZAAR) 50-12.5 MG tablet Take 1 tablet by mouth daily.     [provider]  montelukast (SINGULAIR) 10 MG tablet Take 10 mg by mouth as needed (allergies). 11/28/17   [provider]  potassium chloride (KLOR-CON) 10 MEQ tablet Take 10 mEq by mouth daily. 07/21/20   [provider]  sertraline (ZOLOFT) 100 MG tablet Take 100 mg by mouth daily.     [provider]    Allergies    Lisinopril and Penicillins  Review of Systems   Review of Systems  Constitutional: Negative for fever.  Respiratory: Negative for shortness of breath.   Cardiovascular: Negative for chest pain.  Gastrointestinal: Negative for abdominal pain.  Skin: Positive for rash.  Neurological: Negative for numbness.    Physical Exam Updated Vital Signs BP (!) 180/82 (BP Location: Right Arm)   Pulse 89   Temp 99.2 F (37.3 C) (Oral)   Resp 16   Ht 5\' 4"  (1.626 m)   Wt 75.8 kg   SpO2 99%   BMI 28.67 kg/m   Physical Exam Vitals and nursing note reviewed.  Constitutional:      Appearance: Normal appearance. She is well-developed.  HENT:     Head: Normocephalic and atraumatic.  Eyes:     Conjunctiva/sclera: Conjunctivae normal.  Neck:     Comments: She has 2 areas of some induration in her skin on her lateral neck where she said she was bit.  Minimal erythema.  No crepitus.  Do not see any open wounds. Musculoskeletal:     Cervical back: Normal range of motion and neck supple.  Skin:    General: Skin is warm and dry.  Neurological:     General: No focal deficit present.     Mental Status: She is alert.     GCS: GCS eye subscore  is 4. GCS verbal subscore is 5. GCS motor subscore is 6.     ED Results / Procedures / Treatments   Labs (all labs ordered  are listed, but only abnormal results are displayed) Labs Reviewed - No data to display  EKG None  Radiology No results found.  Procedures Procedures   Medications Ordered in ED Medications - No data to display  ED Course  I have reviewed the triage vital signs and the nursing notes.  Pertinent labs & imaging results that were available during my care of the patient were reviewed by me and considered in my medical decision making (see chart for details).    MDM Rules/Calculators/A&P                           Final Clinical Impression(s) / ED Diagnoses Final diagnoses:  Insect bite of neck, initial encounter    Rx / DC Orders ED Discharge Orders    None       Terrilee Files, MD 02/14/21 [id number]

## 2021-02-14 NOTE — Discharge Instructions (Signed)
You were seen in the emergency department for evaluation of bite to the left side of your neck.  Need to monitor this area for any signs of worsening.  You can do a cool compress to it and apply some topical hydrocortisone cream that you can get at the pharmacy.  Return to the emergency department for any worsening or concerning symptoms

## 2021-02-14 NOTE — ED Triage Notes (Signed)
Insect bite to left lower neck this morning.

## 2021-03-18 DIAGNOSIS — F419 Anxiety disorder, unspecified: Secondary | ICD-10-CM | POA: Diagnosis not present

## 2021-05-06 DIAGNOSIS — F39 Unspecified mood [affective] disorder: Secondary | ICD-10-CM | POA: Diagnosis not present

## 2021-05-06 DIAGNOSIS — F419 Anxiety disorder, unspecified: Secondary | ICD-10-CM | POA: Diagnosis not present

## 2021-05-13 DIAGNOSIS — F419 Anxiety disorder, unspecified: Secondary | ICD-10-CM | POA: Diagnosis not present

## 2021-05-13 DIAGNOSIS — I10 Essential (primary) hypertension: Secondary | ICD-10-CM | POA: Diagnosis not present

## 2021-05-13 DIAGNOSIS — Z0100 Encounter for examination of eyes and vision without abnormal findings: Secondary | ICD-10-CM | POA: Diagnosis not present

## 2021-05-13 DIAGNOSIS — E559 Vitamin D deficiency, unspecified: Secondary | ICD-10-CM | POA: Diagnosis not present

## 2021-05-13 DIAGNOSIS — E785 Hyperlipidemia, unspecified: Secondary | ICD-10-CM | POA: Diagnosis not present

## 2021-05-13 DIAGNOSIS — Z79899 Other long term (current) drug therapy: Secondary | ICD-10-CM | POA: Diagnosis not present

## 2021-05-13 DIAGNOSIS — F32A Depression, unspecified: Secondary | ICD-10-CM | POA: Diagnosis not present

## 2021-05-13 DIAGNOSIS — R600 Localized edema: Secondary | ICD-10-CM | POA: Diagnosis not present

## 2021-05-13 DIAGNOSIS — E119 Type 2 diabetes mellitus without complications: Secondary | ICD-10-CM | POA: Diagnosis not present

## 2021-06-14 DIAGNOSIS — N939 Abnormal uterine and vaginal bleeding, unspecified: Secondary | ICD-10-CM | POA: Diagnosis not present

## 2021-06-14 DIAGNOSIS — N840 Polyp of corpus uteri: Secondary | ICD-10-CM | POA: Diagnosis not present

## 2021-06-18 ENCOUNTER — Ambulatory Visit: Payer: Medicare HMO | Admitting: Cardiology

## 2021-06-24 DIAGNOSIS — N841 Polyp of cervix uteri: Secondary | ICD-10-CM | POA: Diagnosis not present

## 2021-07-19 DIAGNOSIS — F419 Anxiety disorder, unspecified: Secondary | ICD-10-CM | POA: Diagnosis not present

## 2021-07-26 ENCOUNTER — Ambulatory Visit: Payer: Medicare HMO | Admitting: Cardiology

## 2021-08-16 ENCOUNTER — Other Ambulatory Visit: Payer: Self-pay

## 2021-08-16 ENCOUNTER — Ambulatory Visit: Payer: Medicare HMO | Admitting: Cardiology

## 2021-08-16 ENCOUNTER — Encounter: Payer: Self-pay | Admitting: Cardiology

## 2021-08-16 VITALS — BP 138/78 | HR 86 | Ht 63.0 in | Wt 163.0 lb

## 2021-08-16 DIAGNOSIS — E7800 Pure hypercholesterolemia, unspecified: Secondary | ICD-10-CM

## 2021-08-16 DIAGNOSIS — I10 Essential (primary) hypertension: Secondary | ICD-10-CM

## 2021-08-16 NOTE — Progress Notes (Signed)
Cardiology Office Note:    Date:  08/16/2021   [name], DOB Apr 26, 1951, MRN [id number]  PCP:  Heather Lei, MD  Cardiologist:  Heather Brothers, MD   Referring MD: Heather Lei, MD    ASSESSMENT:    1. Essential hypertension   2. High cholesterol    PLAN:    In order of problems listed above:  Primary prevention stressed with patient.  Importance of compliance with diet medication stressed and she vocalized understanding.  She was advised to walk at least half an hour a day 5 days a week and she promises to do so. Essential hypertension: Blood pressure stable and diet was emphasized.  Lifestyle modification urged. Mixed dyslipidemia:: Lipids were reviewed from the [facility] sheet and discussed with the patient at length.  She mentions to me that she is trying to diet and do better.  She was advised to walk on a regular basis and exercise and diet.  She promises to do better. Patient will be seen in follow-up appointment in 9 months or earlier if the patient has any concerns    Medication Adjustments/Labs and Tests Ordered: Current medicines are reviewed at length with the patient today.  Concerns regarding medicines are outlined above.  No orders of the defined types were placed in this encounter.  No orders of the defined types were placed in this encounter.    No chief complaint on file.    History of Present Illness:    Heather Woods is a 70 y.o. female.  Patient has past medical history of essential hypertension and mixed dyslipidemia.  She denies any problems at this time and takes care of activities of daily living.  No chest pain orthopnea or PND.  She also has diet-controlled diabetes mellitus.  She denies any chest pain.  At the time of my evaluation, the patient is alert awake oriented and in no distress.  Past Medical History:  Diagnosis Date   Adult BMI 29.0-29.9 kg/sq m    Allergic bronchitis    Allergic rhinitis 09/08/2017   Allergic rhinosinusitis     Anxiety    Anxiety and depression    At risk for falls    Bronchitis    Depression    Diarrhea    Diet-controlled diabetes mellitus (HCC) 12/11/2020   DM (diabetes mellitus), type 2 (HCC)    Dyslipidemia 09/08/2017   Dyspnea on exertion 09/08/2017   Dysuria    Essential hypertension 09/08/2017   Eustachian catarrh, bilateral    Excess ear wax    Excessive ear wax, bilateral    Grief    High cholesterol    History of chest pain    Hypercholesteremia    Hyperglycemia    Hyperlipidemia    Hypertension    Irregular bowel habits    Menopause    Mood disorder (HCC)    Obesity    Other specified disorders of bone density and structure, other site    Overactive bladder    Overweight    Palpitations 09/08/2017   Panic attack    Shoulder arthritis    Skin lesion of face    Tendinitis, de Quervain's    Urinary tract infection    Vitamin D deficiency    Vitamin D deficiency     Past Surgical History:  Procedure Laterality Date   BREAST BIOPSY  2006   Pathology normal   TONSILLECTOMY  1999   TUBAL LIGATION  1983    Current Medications: Current Meds  Medication Sig   atorvastatin (LIPITOR) 80 MG tablet Take 1 tablet (80 mg total) by mouth daily.   fluticasone (FLONASE) 50 MCG/ACT nasal spray Place 2 sprays into both nostrils as needed for rhinitis.   losartan-hydrochlorothiazide (HYZAAR) 50-12.5 MG tablet Take 1 tablet by mouth daily.    sertraline (ZOLOFT) 100 MG tablet Take 100 mg by mouth daily.      Allergies:   Lisinopril and Penicillins   Social History   Socioeconomic History   Marital status: Divorced    Spouse name: Not on file   Number of children: Not on file   Years of education: Not on file   Highest education level: Not on file  Occupational History   Not on file  Tobacco Use   Smoking status: Never   Smokeless tobacco: Never  Vaping Use   Vaping Use: Never used  Substance and Sexual Activity   Alcohol use: No   Drug use: No   Sexual  activity: Yes    Birth control/protection: Post-menopausal  Other Topics Concern   Not on file  Social History Narrative   Not on file   Social Determinants of Health   Financial Resource Strain: Not on file  Food Insecurity: Not on file  Transportation Needs: Not on file  Physical Activity: Not on file  Stress: Not on file  Social Connections: Not on file     Family History: The patient's family history includes Diabetes in her brother and mother; Hypertension in her brother, father, and sister.  ROS:   Please see the history of present illness.    All other systems reviewed and are negative.  EKGs/Labs/Other Studies Reviewed:    The following studies were reviewed today: I discussed my findings with the patient at length.  Her calcium score was 0 in the past.   Recent Labs: 12/04/2020: ALT 16; BUN 12; Creatinine, Ser 0.70; Potassium 3.7; Sodium 143  Recent Lipid Panel    Component Value Date/Time   CHOL 191 12/04/2020 1649   TRIG 99 12/04/2020 1649   HDL 47 12/04/2020 1649   CHOLHDL 4.1 12/04/2020 1649   LDLCALC 126 (H) 12/04/2020 1649    Physical Exam:    VS:  BP 138/78   Pulse 86   Ht 5\' 3"  (1.6 m)   Wt 163 lb (73.9 kg)   SpO2 98%   BMI 28.87 kg/m     Wt Readings from Last 3 Encounters:  08/16/21 163 lb (73.9 kg)  02/14/21 167 lb (75.8 kg)  12/11/20 163 lb (73.9 kg)     GEN: Patient is in no acute distress HEENT: Normal NECK: No JVD; No carotid bruits LYMPHATICS: No lymphadenopathy CARDIAC: Hear sounds regular, 2/6 systolic murmur at the apex. RESPIRATORY:  Clear to auscultation without rales, wheezing or rhonchi  ABDOMEN: Soft, non-tender, non-distended MUSCULOSKELETAL:  No edema; No deformity  SKIN: Warm and dry NEUROLOGIC:  Alert and oriented x 3 PSYCHIATRIC:  Normal affect   Signed, 12/13/20, MD  08/16/2021 3:59 PM    [facility]

## 2021-08-16 NOTE — Patient Instructions (Signed)

## 2021-08-19 ENCOUNTER — Emergency Department (HOSPITAL_BASED_OUTPATIENT_CLINIC_OR_DEPARTMENT_OTHER)
Admission: EM | Admit: 2021-08-19 | Discharge: 2021-08-19 | Disposition: A | Discharge status: Home / Self Care | Payer: Medicare HMO | Attending: Emergency Medicine | Admitting: Emergency Medicine

## 2021-08-19 ENCOUNTER — Other Ambulatory Visit: Payer: Self-pay

## 2021-08-19 ENCOUNTER — Encounter (HOSPITAL_BASED_OUTPATIENT_CLINIC_OR_DEPARTMENT_OTHER): Payer: Self-pay | Admitting: *Deleted

## 2021-08-19 DIAGNOSIS — I10 Essential (primary) hypertension: Secondary | ICD-10-CM | POA: Diagnosis not present

## 2021-08-19 DIAGNOSIS — H1033 Unspecified acute conjunctivitis, bilateral: Secondary | ICD-10-CM | POA: Insufficient documentation

## 2021-08-19 DIAGNOSIS — E119 Type 2 diabetes mellitus without complications: Secondary | ICD-10-CM | POA: Insufficient documentation

## 2021-08-19 MED ORDER — FLUORESCEIN SODIUM 1 MG OP STRP
1.0000 | ORAL_STRIP | Freq: Once | OPHTHALMIC | Status: AC
  Administered 2021-08-19: 1 via OPHTHALMIC
  Filled 2021-08-19: qty 1

## 2021-08-19 MED ORDER — SULFACETAMIDE SODIUM 10 % OP SOLN: 1.0000 [drp] | Freq: Four times a day (QID) | OPHTHALMIC | 0 refills | Status: AC

## 2021-08-19 NOTE — ED Triage Notes (Signed)
Redness to both eyes. Tearing. Exposed to pink eye.

## 2021-08-19 NOTE — ED Provider Notes (Signed)
[facility] EMERGENCY DEPARTMENT Provider Note   CSN: [id number] Arrival date & time: 08/19/21  1919     History No chief complaint on file.   [facility] is a 70 y.o. female.  HPI  Patient with significant medical history of diabetes, hypertension, hyperlipidemia presents with chief complaint of eye redness.  Patient states she noted that she had some red eyes on Wednesday, states it was on her right  but then it moved into her left eye, she states that she has been having watery discharge and states it feels difficult to open up her eyes in the morning, she states that the eyes are very itchy, and had some slight pain but the pain is since resolved, states that her vision is slightly blurry in the right eye but thinks this is just from the wateriness.  She does not wear contacts, no trauma to the eye, denies  pain with eye movement, no headaches, paresthesias or weakness of lower extremities, denies chest pain, shortness of breath, abdominal pain, fevers or chills.  She does note that she was babysitting her grandson child who had pinkeye and thinks she now has it.  Denies  alleviating or aggravating factors.  Past Medical History:  Diagnosis Date   Adult BMI 29.0-29.9 kg/sq m    Allergic bronchitis    Allergic rhinitis 09/08/2017   Allergic rhinosinusitis    Anxiety    Anxiety and depression    At risk for falls    Bronchitis    Depression    Diarrhea    Diet-controlled diabetes mellitus (HCC) 12/11/2020   DM (diabetes mellitus), type 2 (HCC)    Dyslipidemia 09/08/2017   Dyspnea on exertion 09/08/2017   Dysuria    Essential hypertension 09/08/2017   Eustachian catarrh, bilateral    Excess ear wax    Excessive ear wax, bilateral    Grief    High cholesterol    History of chest pain    Hypercholesteremia    Hyperglycemia    Hyperlipidemia    Hypertension    Irregular bowel habits    Menopause    Mood disorder (HCC)    Obesity    Other specified  disorders of bone density and structure, other site    Overactive bladder    Overweight    Palpitations 09/08/2017   Panic attack    Shoulder arthritis    Skin lesion of face    Tendinitis, de Quervain's    Urinary tract infection    Vitamin D deficiency    Vitamin D deficiency     Patient Active Problem List   Diagnosis Date Noted   Diet-controlled diabetes mellitus (HCC) 12/11/2020   Allergic bronchitis    Allergic rhinosinusitis    Anxiety and depression    At risk for falls    Bronchitis    Diarrhea    Dysuria    Eustachian catarrh, bilateral    Excess ear wax    Excessive ear wax, bilateral    Grief    History of chest pain    Hyperglycemia    Irregular bowel habits    Menopause    Mood disorder (HCC)    Obesity    Other specified disorders of bone density and structure, other site    Overactive bladder    Overweight    Shoulder arthritis    Skin lesion of face    Tendinitis, de Quervain's    Urinary tract infection    Adult BMI 29.0-29.9 kg/sq  m    DM (diabetes mellitus), type 2 ([place])    High cholesterol    Hypercholesteremia    Hyperlipidemia    Hypertension    Vitamin D deficiency 09/08/2017   Allergic rhinitis 09/08/2017   Essential hypertension 09/08/2017   Dyslipidemia 09/08/2017   Dyspnea on exertion 09/08/2017   Palpitations 09/08/2017   Panic attack    Depression    Anxiety     Past Surgical History:  Procedure Laterality Date   BREAST BIOPSY  2006   Pathology normal   [place]     OB History   No obstetric history on file.     Family History  Problem Relation Age of Onset   Diabetes Mother    Hypertension Father    Hypertension Sister    Hypertension Brother    Diabetes Brother     Social History   Tobacco Use   Smoking status: Never   Smokeless tobacco: Never  Vaping Use   Vaping Use: Never used  Substance Use Topics   Alcohol use: No   Drug use: No    Home Medications Prior  to Admission medications   Medication Sig Start Date End Date Taking? Authorizing Provider  sulfacetamide (BLEPH-10) 10 % ophthalmic solution Place 1-2 drops into both eyes 4 (four) times daily for 6 days. 08/19/21 08/25/21 Yes Marcello Fennel, PA-C  atorvastatin (LIPITOR) 80 MG tablet Take 1 tablet (80 mg total) by mouth daily. 12/07/20   Revankar, Reita Cliche, MD  fluticasone (FLONASE) 50 MCG/ACT nasal spray Place 2 sprays into both nostrils as needed for rhinitis. 10/05/20   [provider]  losartan-hydrochlorothiazide (HYZAAR) 50-12.5 MG tablet Take 1 tablet by mouth daily.     [provider]  sertraline (ZOLOFT) 100 MG tablet Take 100 mg by mouth daily.     [provider]    Allergies    Lisinopril and Penicillins  Review of Systems   Review of Systems  Constitutional:  Negative for chills and fever.  HENT:  Negative for congestion, tinnitus and trouble swallowing.   Eyes:  Positive for discharge, redness, itching and visual disturbance. Negative for pain.  Respiratory:  Negative for shortness of breath.   Cardiovascular:  Negative for chest pain.  Gastrointestinal:  Negative for abdominal pain.  Genitourinary:  Negative for enuresis.  Musculoskeletal:  Negative for back pain.  Skin:  Negative for rash.  Neurological:  Negative for dizziness, weakness and headaches.  Hematological:  Does not bruise/bleed easily.   Physical Exam Updated Vital Signs BP (!) 163/89 (BP Location: Right Arm)   Pulse 88   Temp 99.2 F (37.3 C) (Oral)   Resp 18   Ht 5\' 3"  (1.6 m)   Wt 73.9 kg   SpO2 100%   BMI 28.86 kg/m   Physical Exam Vitals and nursing note reviewed.  Constitutional:      General: She is not in acute distress.    Appearance: She is not ill-appearing.  HENT:     Head: Normocephalic and atraumatic.     Nose: No congestion or rhinorrhea.     Mouth/Throat:     Mouth: Mucous membranes are moist.     Pharynx: Oropharynx is clear. No oropharyngeal  exudate or posterior oropharyngeal erythema.  Eyes:     General:        Right eye: Discharge present.        Left eye: Discharge present.    Extraocular Movements:  Extraocular movements intact.     Conjunctiva/sclera: Conjunctivae normal.     Pupils: Pupils are equal, round, and reactive to light.     Comments: There is no periorbital swelling, no erythema around the eyes, scleral injection present worse on the right versus the left, PERRLA, EOMs fully intact, no blood noted in the anterior chain of the eye, no dendritic lesion present.  Cardiovascular:     Rate and Rhythm: Normal rate and regular rhythm.     Pulses: Normal pulses.     Heart sounds: No murmur heard.   No friction rub. No gallop.  Pulmonary:     Effort: No respiratory distress.     Breath sounds: No wheezing, rhonchi or rales.  Skin:    General: Skin is warm and dry.  Neurological:     Mental Status: She is alert.     Comments: No facial asymmetry, no difficulty with word finding, able to follow two-step commands, no unilateral weakness present.  Psychiatric:        Mood and Affect: Mood normal.    ED Results / Procedures / Treatments   Labs (all labs ordered are listed, but only abnormal results are displayed) Labs Reviewed - No data to display  EKG None  Radiology No results found.  Procedures Procedures   Medications Ordered in ED Medications  fluorescein ophthalmic strip 1 strip (1 strip Both Eyes Given 08/19/21 2026)    ED Course  I have reviewed the triage vital signs and the nursing notes.  Pertinent labs & imaging results that were available during my care of the patient were reviewed by me and considered in my medical decision making (see chart for details).    MDM Rules/Calculators/A&P                          Initial impression-presents with eye redness.  She is alert, does not appear in acute distress, vital signs are reassuring.  Will check visual acuity, and further evaluate with a  Woods lamp.  Work-up-  Visual Acuity  Right Eye Distance: 20/50 Left Eye Distance: 20/40 Bilateral Distance: 20/30  Woods lamp was performed there is no pooling of the stain, no other gross abnormalities present.  Rule out- I have low suspicion for orbital cellulitis or periseptal cellulitis as patient had no pain with EOMs, there is no induration, fluctuance noted on exam around eyes.  Low suspicion for keratitis as patient does not wear contacts, eyes were visualized there is no visible dendritic lesion noted, there is no severe amount of purulent discharge noted.  Low suspicion for glaucoma as patient denies painful visual changes.  Low suspicion for hyphema as patient denies recent trauma to the area no blood noted in the anterior chamber.  I have low suspicion for corneal abrasion as there is no trauma to the area, there is no fluorescein uptake.  Low suspicion for corneal ulcer but does not wear contacts, no fluorescein stain uptake.     Plan-  Conjunctivitis-I suspect patient suffering from viral conjunctivitis as started on the left side and transferred to the right side but cannot fully rule out bacterial conjunctivitis.  Will start patient on antibiotics and follow-up with ophthalmologist for further evaluation.  Vital signs have remained stable, no indication for hospital admission.  Patient discussed with attending and they agreed with assessment and plan.  Patient given at home care as well strict return precautions.  Patient verbalized that they understood agreed to  said plan.  Final Clinical Impression(s) / ED Diagnoses Final diagnoses:  Acute conjunctivitis of both eyes, unspecified acute conjunctivitis type    Rx / DC Orders ED Discharge Orders          Ordered    sulfacetamide (BLEPH-10) 10 % ophthalmic solution  4 times daily        08/19/21 2025             Marcello Fennel, PA-C 08/19/21 2028    Horton, Alvin Critchley, DO 08/19/21 2251

## 2021-08-19 NOTE — Discharge Instructions (Addendum)
Likely you have an eye infection, starting on antibiotics please take as prescribed.  You may also use over-the-counter eye lubrication to help with eye irritation.  Please follow-up with an eye doctor as soon as possible, given you the contact  information of 1 above please call  Come back to the emergency department if you develop chest pain, shortness of breath, severe abdominal pain, uncontrolled nausea, vomiting, diarrhea.

## 2021-09-08 DIAGNOSIS — E119 Type 2 diabetes mellitus without complications: Secondary | ICD-10-CM | POA: Diagnosis not present

## 2021-09-15 DIAGNOSIS — Z0000 Encounter for general adult medical examination without abnormal findings: Secondary | ICD-10-CM | POA: Diagnosis not present

## 2021-09-15 DIAGNOSIS — Z9181 History of falling: Secondary | ICD-10-CM | POA: Diagnosis not present

## 2021-09-15 DIAGNOSIS — Z139 Encounter for screening, unspecified: Secondary | ICD-10-CM | POA: Diagnosis not present

## 2021-09-15 DIAGNOSIS — Z1331 Encounter for screening for depression: Secondary | ICD-10-CM | POA: Diagnosis not present

## 2021-09-15 DIAGNOSIS — E785 Hyperlipidemia, unspecified: Secondary | ICD-10-CM | POA: Diagnosis not present

## 2021-09-23 ENCOUNTER — Encounter (HOSPITAL_BASED_OUTPATIENT_CLINIC_OR_DEPARTMENT_OTHER): Payer: Self-pay

## 2021-09-23 ENCOUNTER — Emergency Department (HOSPITAL_BASED_OUTPATIENT_CLINIC_OR_DEPARTMENT_OTHER)
Admission: EM | Admit: 2021-09-23 | Discharge: 2021-09-23 | Disposition: A | Discharge status: Home / Self Care | Payer: Medicare HMO | Attending: Emergency Medicine | Admitting: Emergency Medicine

## 2021-09-23 ENCOUNTER — Other Ambulatory Visit: Payer: Self-pay

## 2021-09-23 ENCOUNTER — Emergency Department (HOSPITAL_BASED_OUTPATIENT_CLINIC_OR_DEPARTMENT_OTHER): Payer: Medicare HMO

## 2021-09-23 DIAGNOSIS — Z20822 Contact with and (suspected) exposure to covid-19: Secondary | ICD-10-CM | POA: Diagnosis not present

## 2021-09-23 DIAGNOSIS — E876 Hypokalemia: Secondary | ICD-10-CM | POA: Insufficient documentation

## 2021-09-23 DIAGNOSIS — E119 Type 2 diabetes mellitus without complications: Secondary | ICD-10-CM | POA: Insufficient documentation

## 2021-09-23 DIAGNOSIS — R000 Tachycardia, unspecified: Secondary | ICD-10-CM | POA: Diagnosis not present

## 2021-09-23 DIAGNOSIS — Z79899 Other long term (current) drug therapy: Secondary | ICD-10-CM | POA: Diagnosis not present

## 2021-09-23 DIAGNOSIS — R002 Palpitations: Secondary | ICD-10-CM | POA: Diagnosis not present

## 2021-09-23 DIAGNOSIS — F419 Anxiety disorder, unspecified: Secondary | ICD-10-CM | POA: Insufficient documentation

## 2021-09-23 DIAGNOSIS — I10 Essential (primary) hypertension: Secondary | ICD-10-CM | POA: Insufficient documentation

## 2021-09-23 LAB — URINALYSIS, MICROSCOPIC (REFLEX): RBC / HPF: NONE SEEN RBC/hpf (ref 0–5)

## 2021-09-23 LAB — RESP PANEL BY RT-PCR (FLU A&B, COVID) ARPGX2
Influenza A by PCR: NEGATIVE
Influenza B by PCR: NEGATIVE
SARS Coronavirus 2 by RT PCR: NEGATIVE

## 2021-09-23 LAB — URINALYSIS, ROUTINE W REFLEX MICROSCOPIC
Bilirubin Urine: NEGATIVE
Glucose, UA: NEGATIVE mg/dL
Hgb urine dipstick: NEGATIVE
Ketones, ur: NEGATIVE mg/dL
Nitrite: NEGATIVE
Protein, ur: NEGATIVE mg/dL
Specific Gravity, Urine: 1.01 (ref 1.005–1.030)
pH: 6 (ref 5.0–8.0)

## 2021-09-23 LAB — COMPREHENSIVE METABOLIC PANEL
ALT: 22 U/L (ref 0–44)
AST: 21 U/L (ref 15–41)
Albumin: 4.1 g/dL (ref 3.5–5.0)
Alkaline Phosphatase: 47 U/L (ref 38–126)
Anion gap: 10 (ref 5–15)
BUN: 14 mg/dL (ref 8–23)
CO2: 25 mmol/L (ref 22–32)
Calcium: 9.5 mg/dL (ref 8.9–10.3)
Chloride: 103 mmol/L (ref 98–111)
Creatinine, Ser: 0.89 mg/dL (ref 0.44–1.00)
GFR, Estimated: >60 mL/min (ref >60)
Glucose, Bld: 142 mg/dL — ABNORMAL HIGH (ref 70–99)
Potassium: 2.9 mmol/L — ABNORMAL LOW (ref 3.5–5.1)
Sodium: 138 mmol/L (ref 135–145)
Total Bilirubin: 0.6 mg/dL (ref 0.3–1.2)
Total Protein: 8 g/dL (ref 6.5–8.1)

## 2021-09-23 LAB — CBC WITH DIFFERENTIAL/PLATELET
Abs Immature Granulocytes: 0.03 10*3/uL (ref 0.00–0.07)
Basophils Absolute: 0.1 10*3/uL (ref 0.0–0.1)
Basophils Relative: 1 %
Eosinophils Absolute: 0.3 10*3/uL (ref 0.0–0.5)
Eosinophils Relative: 3 %
HCT: 39.3 % (ref 36.0–46.0)
Hemoglobin: 12.8 g/dL (ref 12.0–15.0)
Immature Granulocytes: 0 %
Lymphocytes Relative: 35 %
Lymphs Abs: 3.6 10*3/uL (ref 0.7–4.0)
MCH: 26.5 pg (ref 26.0–34.0)
MCHC: 32.6 g/dL (ref 30.0–36.0)
MCV: 81.4 fL (ref 80.0–100.0)
Monocytes Absolute: 0.8 10*3/uL (ref 0.1–1.0)
Monocytes Relative: 7 %
Neutro Abs: 5.8 10*3/uL (ref 1.7–7.7)
Neutrophils Relative %: 54 %
Platelets: 329 10*3/uL (ref 150–400)
RBC: 4.83 MIL/uL (ref 3.87–5.11)
RDW: 13.8 % (ref 11.5–15.5)
WBC: 10.5 10*3/uL (ref 4.0–10.5)
nRBC: 0 % (ref 0.0–0.2)

## 2021-09-23 LAB — LACTIC ACID, PLASMA: Lactic Acid, Venous: 2.3 mmol/L (ref 0.5–1.9)

## 2021-09-23 LAB — MAGNESIUM: Magnesium: 2.3 mg/dL (ref 1.7–2.4)

## 2021-09-23 MED ORDER — SODIUM CHLORIDE 0.9 % IV BOLUS
1000.0000 mL | Freq: Once | INTRAVENOUS | Status: AC
  Administered 2021-09-23: 14:00:00 1000 mL via INTRAVENOUS

## 2021-09-23 MED ORDER — LORAZEPAM 1 MG PO TABS
0.5000 mg | ORAL_TABLET | Freq: Once | ORAL | Status: AC
  Administered 2021-09-23: 16:00:00 0.5 mg via ORAL
  Filled 2021-09-23: qty 1

## 2021-09-23 MED ORDER — SODIUM CHLORIDE 0.9 % IV SOLN: INTRAVENOUS | Status: DC | PRN

## 2021-09-23 MED ORDER — POTASSIUM CHLORIDE CRYS ER 20 MEQ PO TBCR: 20.0000 meq | EXTENDED_RELEASE_TABLET | Freq: Two times a day (BID) | ORAL | 0 refills | Status: DC

## 2021-09-23 MED ORDER — POTASSIUM CHLORIDE CRYS ER 20 MEQ PO TBCR
40.0000 meq | EXTENDED_RELEASE_TABLET | Freq: Once | ORAL | Status: AC
  Administered 2021-09-23: 15:00:00 40 meq via ORAL
  Filled 2021-09-23: qty 2

## 2021-09-23 MED ORDER — SODIUM CHLORIDE 0.9 % IV BOLUS
1000.0000 mL | Freq: Once | INTRAVENOUS | Status: AC
  Administered 2021-09-23: 16:00:00 1000 mL via INTRAVENOUS

## 2021-09-23 MED ORDER — POTASSIUM CHLORIDE 10 MEQ/100ML IV SOLN
10.0000 meq | Freq: Once | INTRAVENOUS | Status: AC
  Administered 2021-09-23: 15:00:00 10 meq via INTRAVENOUS
  Filled 2021-09-23: qty 100

## 2021-09-23 NOTE — Discharge Instructions (Addendum)
You came to the emergency department today to be evaluated for your palpitations/tachycardia.  Your physical exam was reassuring.  Your palpitations/tachycardia improved while in the emergency department.  Your lab work showed that your potassium was low.  This is likely due to your fasting.  You were given potassium in the emergency department.  I have given you prescription for potassium to take over the next few days, please follow-up with your primary care provider for repeat assessment of this lab value.  Due to reports of palpitations/tachycardia you will need to follow-up with your cardiologist.  Today you received medications that may make you sleepy or impair your ability to make decisions.  For the next 24 hours please do not drive, operate heavy machinery, care for a small child with out another adult present, or perform any activities that may cause harm to you or someone else if you were to fall asleep or be impaired.   Get help right away if you: Have pain in your chest, upper arms, jaw, or neck. Become weak or dizzy. Feel faint. Have palpitations that do not go away.

## 2021-09-23 NOTE — ED Triage Notes (Signed)
Pt c/o heart racing x 45 min-denies CP-NAD-steady gait

## 2021-09-23 NOTE — ED Provider Notes (Signed)
[facility] Provider Note   CSN: [id number] Arrival date & time: 09/23/21  1340     History Chief Complaint  Patient presents with   Tachycardia    [facility] is a 70 y.o. female presents the emergency department with a chief complaint of tachycardia.  States that her heart has been "racing," over the last 45 minutes.  States that symptoms started after eating some candy.  Patient reports that she has had similar episodes in the past however cannot remember any details on past events.  Patient denies any chest pain, shortness of breath, lightheadedness, dizziness, syncope, leg swelling or tenderness.  Denies any fever, chills, rhinorrhea, nasal congestion, sore throat, cough, abdominal pain, nausea, vomiting, diarrhea, dysuria, hematuria, urinary urgency.  Patient reports that she does have a history of anxiety.  States that she takes an SSRI for anxiety.  Orts that she feels like she is having anxiety attack since arriving in the emergency department.  HPI     Past Medical History:  Diagnosis Date   Adult BMI 29.0-29.9 kg/sq m    Allergic bronchitis    Allergic rhinitis 09/08/2017   Allergic rhinosinusitis    Anxiety    Anxiety and depression    At risk for falls    Bronchitis    Depression    Diarrhea    Diet-controlled diabetes mellitus ([place]) 12/11/2020   DM (diabetes mellitus), type 2 ([place])    Dyslipidemia 09/08/2017   Dyspnea on exertion 09/08/2017   Dysuria    Essential hypertension 09/08/2017   Eustachian catarrh, bilateral    Excess ear wax    Excessive ear wax, bilateral    Grief    High cholesterol    History of chest pain    Hypercholesteremia    Hyperglycemia    Hyperlipidemia    Hypertension    Irregular bowel habits    Menopause    Mood disorder ([place])    Obesity    Other specified disorders of bone density and structure, other site    Overactive bladder    Overweight    Palpitations 09/08/2017   Panic attack     Shoulder arthritis    Skin lesion of face    Tendinitis, de Quervain's    Urinary tract infection    Vitamin D deficiency    Vitamin D deficiency     Patient Active Problem List   Diagnosis Date Noted   Diet-controlled diabetes mellitus ([place]) 12/11/2020   Allergic bronchitis    Allergic rhinosinusitis    Anxiety and depression    At risk for falls    Bronchitis    Diarrhea    Dysuria    Eustachian catarrh, bilateral    Excess ear wax    Excessive ear wax, bilateral    Grief    History of chest pain    Hyperglycemia    Irregular bowel habits    Menopause    Mood disorder ([place])    Obesity    Other specified disorders of bone density and structure, other site    Overactive bladder    Overweight    Shoulder arthritis    Skin lesion of face    Tendinitis, de Quervain's    Urinary tract infection    Adult BMI 29.0-29.9 kg/sq m    DM (diabetes mellitus), type 2 ([place])    High cholesterol    Hypercholesteremia    Hyperlipidemia    Hypertension    Vitamin D deficiency 09/08/2017  Allergic rhinitis 09/08/2017   Essential hypertension 09/08/2017   Dyslipidemia 09/08/2017   Dyspnea on exertion 09/08/2017   Palpitations 09/08/2017   Panic attack    Depression    Anxiety     Past Surgical History:  Procedure Laterality Date   BREAST BIOPSY  2006   Pathology normal   [place]     OB History   No obstetric history on file.     Family History  Problem Relation Age of Onset   Diabetes Mother    Hypertension Father    Hypertension Sister    Hypertension Brother    Diabetes Brother     Social History   Tobacco Use   Smoking status: Never   Smokeless tobacco: Never  Vaping Use   Vaping Use: Never used  Substance Use Topics   Alcohol use: No   Drug use: No    Home Medications Prior to Admission medications   Medication Sig Start Date End Date Taking? Authorizing Provider  atorvastatin (LIPITOR) 80 MG tablet Take  1 tablet (80 mg total) by mouth daily. 12/07/20   Revankar, Reita Cliche, MD  fluticasone (FLONASE) 50 MCG/ACT nasal spray Place 2 sprays into both nostrils as needed for rhinitis. 10/05/20   [provider]  losartan-hydrochlorothiazide (HYZAAR) 50-12.5 MG tablet Take 1 tablet by mouth daily.     [provider]  sertraline (ZOLOFT) 100 MG tablet Take 100 mg by mouth daily.     [provider]    Allergies    Lisinopril and Penicillins  Review of Systems   Review of Systems  Constitutional:  Negative for chills and fever.  HENT:  Negative for congestion, rhinorrhea and sore throat.   Eyes:  Negative for visual disturbance.  Respiratory:  Negative for cough and shortness of breath.   Cardiovascular:  Positive for palpitations. Negative for chest pain and leg swelling.  Gastrointestinal:  Negative for abdominal pain, diarrhea, nausea and vomiting.  Genitourinary:  Negative for difficulty urinating, dysuria, frequency, hematuria and urgency.  Musculoskeletal:  Negative for back pain and neck pain.  Skin:  Negative for color change and rash.  Neurological:  Negative for dizziness, syncope, light-headedness and headaches.  Psychiatric/Behavioral:  Negative for confusion.    Physical Exam Updated Vital Signs BP (!) 187/91 (BP Location: Left Arm)    Pulse (!) 141    Temp 99 F (37.2 C) (Oral)    Resp 20    Ht 5\' 3"  (1.6 m)    Wt 73.5 kg    SpO2 98%    BMI 28.70 kg/m   Physical Exam Vitals and nursing note reviewed.  Constitutional:      General: She is not in acute distress.    Appearance: She is not ill-appearing, toxic-appearing or diaphoretic.  HENT:     Head: Normocephalic.  Eyes:     General: No scleral icterus.       Right eye: No discharge.        Left eye: No discharge.  Cardiovascular:     Rate and Rhythm: Tachycardia present.     Pulses:          Radial pulses are 2+ on the right side and 2+ on the left side.     Heart sounds: Normal heart sounds,  S1 normal and S2 normal. Heart sounds not distant. No murmur heard.    Comments: Tachycardic at rate of 141 Pulmonary:     Effort: Pulmonary effort  is normal. No tachypnea, bradypnea or respiratory distress.     Breath sounds: Normal breath sounds. No stridor. No decreased breath sounds, wheezing, rhonchi or rales.     Comments: Speaks in full complete sentences Abdominal:     General: There is no distension. There are no signs of injury.     Palpations: Abdomen is soft. There is no mass or pulsatile mass.     Tenderness: There is no abdominal tenderness. There is no guarding or rebound.  Musculoskeletal:     Right lower leg: Normal. No swelling. No edema.     Left lower leg: Normal. No swelling. No edema.  Skin:    General: Skin is warm and dry.  Neurological:     General: No focal deficit present.     Mental Status: She is alert.     GCS: GCS eye subscore is 4. GCS verbal subscore is 5. GCS motor subscore is 6.  Psychiatric:        Mood and Affect: Mood is anxious.        Behavior: Behavior is cooperative.    ED Results / Procedures / Treatments   Labs (all labs ordered are listed, but only abnormal results are displayed) Labs Reviewed  COMPREHENSIVE METABOLIC PANEL - Abnormal; Notable for the following components:      Result Value   Potassium 2.9 (*)    Glucose, Bld 142 (*)    All other components within normal limits  URINALYSIS, ROUTINE W REFLEX MICROSCOPIC - Abnormal; Notable for the following components:   Color, Urine STRAW (*)    Leukocytes,Ua SMALL (*)    All other components within normal limits  LACTIC ACID, PLASMA - Abnormal; Notable for the following components:   Lactic Acid, Venous 2.3 (*)    All other components within normal limits  URINALYSIS, MICROSCOPIC (REFLEX) - Abnormal; Notable for the following components:   Bacteria, UA RARE (*)    All other components within normal limits  RESP PANEL BY RT-PCR (FLU A&B, COVID) ARPGX2  CBC WITH  DIFFERENTIAL/PLATELET  MAGNESIUM  LACTIC ACID, PLASMA    EKG EKG Interpretation  Date/Time:  Thursday September 23 2021 13:55:02 EST Ventricular Rate:  132 PR Interval:  135 QRS Duration: 148 QT Interval:  335 QTC Calculation: 497 R Axis:   46 Text Interpretation: Sinus tachycardia Right bundle branch block increased rate from prior 1/22 Confirmed by Aletta Edouard [phone]) on 09/23/2021 2:04:48 PM  Radiology DG Chest Portable 1 View  Result Date: 09/23/2021 CLINICAL DATA:  Heart racing. EXAM: PORTABLE CHEST 1 VIEW COMPARISON:  January 07, 2017. FINDINGS: The heart size and mediastinal contours are within normal limits. Both lungs are clear. The visualized skeletal structures are unremarkable. IMPRESSION: No active disease. Electronically Signed   By: Marijo Conception M.D.   On: 09/23/2021 14:28    Procedures Procedures   Medications Ordered in ED Medications  0.9 %  sodium chloride infusion (0 mLs Intravenous Stopped 09/23/21 1736)  sodium chloride 0.9 % bolus 1,000 mL (0 mLs Intravenous Stopped 09/23/21 1554)  potassium chloride SA (KLOR-CON M) CR tablet 40 mEq (40 mEq Oral Given 09/23/21 1452)  potassium chloride 10 mEq in 100 mL IVPB (0 mEq Intravenous Stopped 09/23/21 1612)  sodium chloride 0.9 % bolus 1,000 mL (0 mLs Intravenous Stopped 09/23/21 1736)  LORazepam (ATIVAN) tablet 0.5 mg (0.5 mg Oral Given 09/23/21 1554)    ED Course  I have reviewed the triage vital signs and the nursing notes.  Pertinent labs &  imaging results that were available during my care of the patient were reviewed by me and considered in my medical decision making (see chart for details).    MDM Rules/Calculators/A&P                          Alert 70 year old female in no acute distress, nontoxic-appearing.  Presents emergency department chief complaint of palpitations.  States that her heart has been "racing," over the last 45 minutes.  Patient does endorse feeling increased anxiety since  arriving in the emergency department.  Patient noted to be tachycardic at rate of 141 on arrival.  While examining and speaking with patient her heart rate has gradually improved and is to be tachycardic 100s to 110s.  EKG shows sinus tachycardia.  We will give patient fluid bolus and look for possible source of infection.  Chest x-ray shows no active cardiopulmonary disease.  Urinalysis shows no signs of infection.  CBC shows no leukocytosis or anemia.  Patient noted to have hypokalemia and increased lactic acid.  Suspect this is due to patient's fasting over the last 24 hours.  Patient states that she did not have anything to eat or drink over the last 24 hours.  Patient received 2 L normal saline fluid bolus.  Tachycardia has resolved.  Repeat EKG shows similar morphology to previous documented.  We will have patient follow-up with her cardiologist for further evaluation of her tachycardia/palpitations.  Will prescribe patient with 5-day course of potassium and have her follow-up with her PCP for repeat evaluation.   Discussed results, findings, treatment and follow up. Patient advised of return precautions. Patient verbalized understanding and agreed with plan.  Patient care discussed with attending physician Dr. Audley Hose.     Final Clinical Impression(s) / ED Diagnoses Final diagnoses:  Tachycardia  Hypokalemia    Rx / DC Orders ED Discharge Orders          Ordered    potassium chloride SA (KLOR-CON M) 20 MEQ tablet  2 times daily        09/23/21 1829             Berneice Heinrich 09/23/21 1840    Cheryll Cockayne, MD 09/23/21 [phone]

## 2021-11-18 DIAGNOSIS — F39 Unspecified mood [affective] disorder: Secondary | ICD-10-CM | POA: Diagnosis not present

## 2021-11-18 DIAGNOSIS — F419 Anxiety disorder, unspecified: Secondary | ICD-10-CM | POA: Diagnosis not present

## 2021-11-30 DIAGNOSIS — Z6830 Body mass index (BMI) 30.0-30.9, adult: Secondary | ICD-10-CM | POA: Diagnosis not present

## 2021-11-30 DIAGNOSIS — E119 Type 2 diabetes mellitus without complications: Secondary | ICD-10-CM | POA: Diagnosis not present

## 2021-11-30 DIAGNOSIS — I10 Essential (primary) hypertension: Secondary | ICD-10-CM | POA: Diagnosis not present

## 2021-11-30 DIAGNOSIS — Z79899 Other long term (current) drug therapy: Secondary | ICD-10-CM | POA: Diagnosis not present

## 2021-11-30 DIAGNOSIS — E559 Vitamin D deficiency, unspecified: Secondary | ICD-10-CM | POA: Diagnosis not present

## 2021-11-30 DIAGNOSIS — Z23 Encounter for immunization: Secondary | ICD-10-CM | POA: Diagnosis not present

## 2021-11-30 DIAGNOSIS — N950 Postmenopausal bleeding: Secondary | ICD-10-CM | POA: Diagnosis not present

## 2021-11-30 DIAGNOSIS — R600 Localized edema: Secondary | ICD-10-CM | POA: Diagnosis not present

## 2021-11-30 DIAGNOSIS — E785 Hyperlipidemia, unspecified: Secondary | ICD-10-CM | POA: Diagnosis not present

## 2021-11-30 DIAGNOSIS — F419 Anxiety disorder, unspecified: Secondary | ICD-10-CM | POA: Diagnosis not present

## 2021-12-16 DIAGNOSIS — F419 Anxiety disorder, unspecified: Secondary | ICD-10-CM | POA: Diagnosis not present

## 2021-12-30 ENCOUNTER — Emergency Department (HOSPITAL_BASED_OUTPATIENT_CLINIC_OR_DEPARTMENT_OTHER): Payer: Medicare HMO

## 2021-12-30 ENCOUNTER — Other Ambulatory Visit: Payer: Self-pay

## 2021-12-30 ENCOUNTER — Emergency Department (HOSPITAL_BASED_OUTPATIENT_CLINIC_OR_DEPARTMENT_OTHER)
Admission: EM | Admit: 2021-12-30 | Discharge: 2021-12-30 | Disposition: A | Discharge status: Home / Self Care | Payer: Medicare HMO | Attending: Emergency Medicine | Admitting: Emergency Medicine

## 2021-12-30 ENCOUNTER — Encounter (HOSPITAL_BASED_OUTPATIENT_CLINIC_OR_DEPARTMENT_OTHER): Payer: Self-pay | Admitting: Emergency Medicine

## 2021-12-30 DIAGNOSIS — I10 Essential (primary) hypertension: Secondary | ICD-10-CM | POA: Diagnosis not present

## 2021-12-30 DIAGNOSIS — E876 Hypokalemia: Secondary | ICD-10-CM | POA: Diagnosis not present

## 2021-12-30 DIAGNOSIS — M79604 Pain in right leg: Secondary | ICD-10-CM | POA: Insufficient documentation

## 2021-12-30 DIAGNOSIS — Z79899 Other long term (current) drug therapy: Secondary | ICD-10-CM | POA: Diagnosis not present

## 2021-12-30 DIAGNOSIS — E119 Type 2 diabetes mellitus without complications: Secondary | ICD-10-CM | POA: Diagnosis not present

## 2021-12-30 DIAGNOSIS — M25561 Pain in right knee: Secondary | ICD-10-CM | POA: Insufficient documentation

## 2021-12-30 DIAGNOSIS — M7989 Other specified soft tissue disorders: Secondary | ICD-10-CM | POA: Insufficient documentation

## 2021-12-30 DIAGNOSIS — M79661 Pain in right lower leg: Secondary | ICD-10-CM | POA: Diagnosis not present

## 2021-12-30 DIAGNOSIS — R000 Tachycardia, unspecified: Secondary | ICD-10-CM | POA: Insufficient documentation

## 2021-12-30 LAB — CBC WITH DIFFERENTIAL/PLATELET
Abs Immature Granulocytes: 0.02 10*3/uL (ref 0.00–0.07)
Basophils Absolute: 0 10*3/uL (ref 0.0–0.1)
Basophils Relative: 0 %
Eosinophils Absolute: 0.2 10*3/uL (ref 0.0–0.5)
Eosinophils Relative: 3 %
HCT: 37.2 % (ref 36.0–46.0)
Hemoglobin: 12.3 g/dL (ref 12.0–15.0)
Immature Granulocytes: 0 %
Lymphocytes Relative: 31 %
Lymphs Abs: 2.5 10*3/uL (ref 0.7–4.0)
MCH: 26.7 pg (ref 26.0–34.0)
MCHC: 33.1 g/dL (ref 30.0–36.0)
MCV: 80.9 fL (ref 80.0–100.0)
Monocytes Absolute: 0.6 10*3/uL (ref 0.1–1.0)
Monocytes Relative: 8 %
Neutro Abs: 4.6 10*3/uL (ref 1.7–7.7)
Neutrophils Relative %: 58 %
Platelets: 270 10*3/uL (ref 150–400)
RBC: 4.6 MIL/uL (ref 3.87–5.11)
RDW: 13.5 % (ref 11.5–15.5)
WBC: 8.1 10*3/uL (ref 4.0–10.5)
nRBC: 0 % (ref 0.0–0.2)

## 2021-12-30 LAB — BASIC METABOLIC PANEL
Anion gap: 8 (ref 5–15)
BUN: 17 mg/dL (ref 8–23)
CO2: 25 mmol/L (ref 22–32)
Calcium: 9.4 mg/dL (ref 8.9–10.3)
Chloride: 106 mmol/L (ref 98–111)
Creatinine, Ser: 0.85 mg/dL (ref 0.44–1.00)
GFR, Estimated: >60 mL/min (ref >60)
Glucose, Bld: 149 mg/dL — ABNORMAL HIGH (ref 70–99)
Potassium: 3.1 mmol/L — ABNORMAL LOW (ref 3.5–5.1)
Sodium: 139 mmol/L (ref 135–145)

## 2021-12-30 MED ORDER — POTASSIUM CHLORIDE CRYS ER 20 MEQ PO TBCR
40.0000 meq | EXTENDED_RELEASE_TABLET | Freq: Once | ORAL | Status: AC
  Administered 2021-12-30: 40 meq via ORAL
  Filled 2021-12-30: qty 2

## 2021-12-30 MED ORDER — OXYCODONE-ACETAMINOPHEN 5-325 MG PO TABS
1.0000 | ORAL_TABLET | Freq: Once | ORAL | Status: AC
  Administered 2021-12-30: 1 via ORAL
  Filled 2021-12-30: qty 1

## 2021-12-30 NOTE — ED Provider Notes (Signed)
?[facility] EMERGENCY DEPARTMENT ?Provider Note ? ? ?CSN: [id number] ?Arrival date & time: 12/30/21  1644 ? ?  ? ?History ? ?Chief Complaint  ?Patient presents with  ? Leg Pain  ? ? ?Heather Woods is a 71 y.o. female with PMHx HTN, hypercholesteremia, anxiety, diabetes who presents to the ED today with complaint of gradual onset, constant, achy, R lower leg pain x 4 days. Pt states when she ambulates she has pain in her knee that radiates down her lower leg. She also feels like her leg is more swollen compared to the left. She admits she is concerned that she could have a blood clot. Denies hx of same. No chest pain or SOB. Denies any trauma to the leg.  ? ?Pt also concerned regarding her blood pressure. Reports she took her BP meds this morning. She does not regularly check her blood pressure at home however states it is "normal" when she goes to the doctor. Blood pressure 187/97 on arrival.  ? ?The history is provided by the patient and medical records.  ? ?  ? ?Home Medications ?Prior to Admission medications   ?Medication Sig Start Date End Date Taking? Authorizing Provider  ?atorvastatin (LIPITOR) 80 MG tablet Take 1 tablet (80 mg total) by mouth daily. 12/07/20   Revankar, Aundra Dubin, MD  ?fluticasone (FLONASE) 50 MCG/ACT nasal spray Place 2 sprays into both nostrils as needed for rhinitis. 10/05/20   [provider]  ?losartan-hydrochlorothiazide (HYZAAR) 50-12.5 MG tablet Take 1 tablet by mouth daily.     [provider]  ?potassium chloride SA (KLOR-CON M) 20 MEQ tablet Take 1 tablet (20 mEq total) by mouth 2 (two) times daily for 5 days. 09/23/21 09/28/21  Haskel Schroeder, PA-C  ?sertraline (ZOLOFT) 100 MG tablet Take 100 mg by mouth daily.     [provider]  ?   ? ?Allergies    ?Lisinopril and Penicillins   ? ?Review of Systems   ?Review of Systems  ?Constitutional:  Negative for chills and fever.  ?Respiratory:  Negative for shortness of breath.   ?Cardiovascular:   Positive for leg swelling. Negative for chest pain.  ?Musculoskeletal:  Positive for arthralgias.  ?Skin:  Negative for color change.  ?All other systems reviewed and are negative. ? ?Physical Exam ?Updated Vital Signs ?BP (!) 177/96 (BP Location: Left Arm) Comment: notified RN  Pulse 100   Temp 98.2 ?F (36.8 ?C) (Oral)   Resp 18   Ht 5\' 4"  (1.626 m)   Wt 75.4 kg   SpO2 99%   BMI 28.55 kg/m?  ?Physical Exam ?Vitals and nursing note reviewed.  ?Constitutional:   ?   Appearance: She is not ill-appearing or diaphoretic.  ?   Comments: Appears anxious  ?HENT:  ?   Head: Normocephalic and atraumatic.  ?Eyes:  ?   Conjunctiva/sclera: Conjunctivae normal.  ?Cardiovascular:  ?   Rate and Rhythm: Regular rhythm. Tachycardia present.  ?   Pulses: Normal pulses.  ?Pulmonary:  ?   Effort: Pulmonary effort is normal.  ?   Breath sounds: Normal breath sounds. No wheezing, rhonchi or rales.  ?Musculoskeletal:  ?   Comments: Very mild swelling to RLE compared to LLE. No overlying skin changes including erythema or increased warmth. No rash. No obvious calf TTP at this time however pt endorses intermittent pain to calf. No TTP to knee at this time. ROM intact to hip, knee, and ankle. 2+ DP pulse. Cap refill < 2 seconds to all  toes.   ?Skin: ?   General: Skin is warm and dry.  ?   Coloration: Skin is not jaundiced.  ?Neurological:  ?   Mental Status: She is alert.  ? ? ?ED Results / Procedures / Treatments   ?Labs ?(all labs ordered are listed, but only abnormal results are displayed) ?Labs Reviewed  ?BASIC METABOLIC PANEL - Abnormal; Notable for the following components:  ?    Result Value  ? Potassium 3.1 (*)   ? Glucose, Bld 149 (*)   ? All other components within normal limits  ?CBC WITH DIFFERENTIAL/PLATELET  ? ? ?EKG ?None ? ?Radiology ?DG Knee Complete 4 Views Right ? ?Result Date: 12/30/2021 ?CLINICAL DATA:  Right knee pain and swelling. EXAM: RIGHT KNEE - COMPLETE 4+ VIEW COMPARISON:  None. FINDINGS: No evidence of  fracture, dislocation, or joint effusion. Mild enthesopathic changes seen along the superior margin of the patella. No evidence of arthropathy or other focal bone abnormality. Soft tissues are unremarkable. IMPRESSION: No acute findings. Mild patellar enthesopathy. Electronically Signed   By: Marlaine Hind M.D.   On: 12/30/2021 17:14   ? ?Procedures ?Procedures  ? ? ?Medications Ordered in ED ?Medications  ?oxyCODONE-acetaminophen (PERCOCET/ROXICET) 5-325 MG per tablet 1 tablet (1 tablet Oral Given 12/30/21 1727)  ?potassium chloride SA (KLOR-CON M) CR tablet 40 mEq (40 mEq Oral Given 12/30/21 1853)  ? ? ?ED Course/ Medical Decision Making/ A&P ?Clinical Course as of 12/30/21 1905  ?Thu Dec 30, 2021  ?1728 BP 150/83 [MV]  ?1853 US Venous Img Lower Right (DVT Study) ?IMPRESSION: ?Negative. ? ? [MV]  ?  ?Clinical Course User Index ?[MV] Eustaquio Maize, PA-C  ? ?                        ?Medical Decision Making ?71 year old female presents to the ED today with complaint of atraumatic right lower extremity pain and swelling for the past 4 days.  Reports pain is mostly in her knee and radiates down her right lower extremity with ambulation.  She is also concerned regarding her blood pressure.  Blood pressure elevated today at 187/97 with repeat 170/93.  Reports compliance with her losartan-HCTZ.  Last took dose this morning.  She does appear extremely anxious on arrival.  I do suspect this is likely causing a false elevation in her blood pressure.  She is also noted to be tachycardic.  He denies any chest pain or shortness of breath.  Will provide pain medication and reevaluate blood pressure and heart rate.  We will plan for DVT study to at this time however we will also plan for x-ray of the knee for further evaluation given pain appears to be mostly in this area with ambulation.  We will plan for lab work in case patient needs to be started on DOAC.  ? ?On reevaluation patient resting comfortably.  She is currently taking  her KDUR. Her blood pressure has slightly increased 177/96 currently however continues to be asymptomatic.  I have recommended that she follow-up with her PCP for same.  I again think that her blood pressure is elevated secondary to anxiety.  I have very low suspicion for hypertensive urgency today.  Kidney function is stable at 0.85.  Patient recommended to take Ibuprofen and Tylenol as needed for pain. Knee brace provided for same. Pt instructed on RICE therapy. She is stable for discharge home at this time.  ? ?Problems Addressed: ?Acute pain of right knee: acute illness  or injury ?Hypokalemia: acute illness or injury ?Right leg pain: acute illness or injury ? ?Amount and/or Complexity of Data Reviewed ?Labs: ordered. ?   Details: CBC without leukocytosis. Hgb stable at 12.3 ?BMP with K 3.1; repleted in the ED today. No other electrolyte abnormalities. ?Radiology: ordered. Decision-making details documented in ED Course. ?   Details: Knee xray viewed by me - no acute findings. Confirmed by radiologist however does have mild patellar enthesopathy.  ? ?DVT study negative ? ?Risk ?Prescription drug management. ? ? ? ? ? ? ? ? ? ?Final Clinical Impression(s) / ED Diagnoses ?Final diagnoses:  ?Right leg pain  ?Acute pain of right knee  ?Hypokalemia  ? ? ?Rx / DC Orders ?ED Discharge Orders   ? ? None  ? ?  ? ? ? ?Discharge Instructions   ? ?  ?Please follow up with your PCP in 1-2 weeks for further evaluation of your leg pain as well as to get your potassium level rechecked as it was slightly low today. Continue taking your potassium medication at home as prescribed.  ? ?Wear the knee brace for symptomatic relief. While at home please rest, ice, and elevate your leg to help with pain. You can take Ibuprofen and Tylenol as needed for pain. You can also apply OTC Voltaren gel for same.  ? ?Return to the ED for any new/worsening symptoms ? ? ? ? ?  ?Eustaquio Maize, PA-C ?12/30/21 1906 ? ?  ?Lennice Sites, DO ?12/30/21  2319 ? ?

## 2021-12-30 NOTE — ED Triage Notes (Signed)
Pt reports right lower lateral leg pain that started Monday. Pt denies injury to the area.  ?

## 2021-12-30 NOTE — Discharge Instructions (Signed)
Please follow up with your PCP in 1-2 weeks for further evaluation of your leg pain as well as to get your potassium level rechecked as it was slightly low today. Continue taking your potassium medication at home as prescribed.  ? ?Wear the knee brace for symptomatic relief. While at home please rest, ice, and elevate your leg to help with pain. You can take Ibuprofen and Tylenol as needed for pain. You can also apply OTC Voltaren gel for same.  ? ?Return to the ED for any new/worsening symptoms ?

## 2022-01-02 ENCOUNTER — Other Ambulatory Visit: Payer: Self-pay | Admitting: Cardiology

## 2022-01-02 DIAGNOSIS — E782 Mixed hyperlipidemia: Secondary | ICD-10-CM

## 2022-01-04 DIAGNOSIS — K219 Gastro-esophageal reflux disease without esophagitis: Secondary | ICD-10-CM | POA: Diagnosis not present

## 2022-01-04 DIAGNOSIS — J309 Allergic rhinitis, unspecified: Secondary | ICD-10-CM | POA: Diagnosis not present

## 2022-01-04 DIAGNOSIS — M25461 Effusion, right knee: Secondary | ICD-10-CM | POA: Diagnosis not present

## 2022-01-04 DIAGNOSIS — M25561 Pain in right knee: Secondary | ICD-10-CM | POA: Diagnosis not present

## 2022-01-04 DIAGNOSIS — Z6830 Body mass index (BMI) 30.0-30.9, adult: Secondary | ICD-10-CM | POA: Diagnosis not present

## 2022-01-13 DIAGNOSIS — S83241A Other tear of medial meniscus, current injury, right knee, initial encounter: Secondary | ICD-10-CM | POA: Diagnosis not present

## 2022-01-24 DIAGNOSIS — Z1231 Encounter for screening mammogram for malignant neoplasm of breast: Secondary | ICD-10-CM | POA: Diagnosis not present

## 2022-02-19 ENCOUNTER — Encounter (HOSPITAL_BASED_OUTPATIENT_CLINIC_OR_DEPARTMENT_OTHER): Payer: Self-pay | Admitting: Emergency Medicine

## 2022-02-19 ENCOUNTER — Other Ambulatory Visit: Payer: Self-pay

## 2022-02-19 ENCOUNTER — Emergency Department (HOSPITAL_BASED_OUTPATIENT_CLINIC_OR_DEPARTMENT_OTHER)
Admission: EM | Admit: 2022-02-19 | Discharge: 2022-02-19 | Disposition: A | Discharge status: Home / Self Care | Payer: Medicare HMO | Attending: Emergency Medicine | Admitting: Emergency Medicine

## 2022-02-19 DIAGNOSIS — J0111 Acute recurrent frontal sinusitis: Secondary | ICD-10-CM | POA: Diagnosis not present

## 2022-02-19 DIAGNOSIS — I10 Essential (primary) hypertension: Secondary | ICD-10-CM | POA: Insufficient documentation

## 2022-02-19 DIAGNOSIS — Z7951 Long term (current) use of inhaled steroids: Secondary | ICD-10-CM | POA: Insufficient documentation

## 2022-02-19 DIAGNOSIS — R519 Headache, unspecified: Secondary | ICD-10-CM | POA: Diagnosis present

## 2022-02-19 DIAGNOSIS — Z79899 Other long term (current) drug therapy: Secondary | ICD-10-CM | POA: Insufficient documentation

## 2022-02-19 DIAGNOSIS — J0110 Acute frontal sinusitis, unspecified: Secondary | ICD-10-CM | POA: Diagnosis not present

## 2022-02-19 MED ORDER — DOXYCYCLINE HYCLATE 100 MG PO CAPS: 100.0000 mg | ORAL_CAPSULE | Freq: Two times a day (BID) | ORAL | 0 refills | Status: DC

## 2022-02-19 MED ORDER — DOXYCYCLINE HYCLATE 100 MG PO TABS
100.0000 mg | ORAL_TABLET | Freq: Once | ORAL | Status: AC
  Administered 2022-02-19: 100 mg via ORAL
  Filled 2022-02-19: qty 1

## 2022-02-19 NOTE — ED Triage Notes (Signed)
Pt arrives pov, steady gait c/o HA x 2 days and hypertension today. Denies CP

## 2022-02-19 NOTE — ED Provider Notes (Signed)
[facility] EMERGENCY DEPARTMENT Provider Note   CSN: [id number] Arrival date & time: 02/19/22  1214     History  Chief Complaint  Patient presents with   Hypertension    Heather Woods is a 71 y.o. female.  71 yo F with a chief complaint of a frontal headache.  This been going on since yesterday.  She describes it as mild.  Goes from 1 temple to the other.  She has had chronic congestion.  Had noted that her blood pressure was little bit elevated.  When she leans her head severely to the right she hears a whooshing sound in that right ear.  She tells me that this is new since yesterday.  No one-sided numbness or weakness no difficulty with speech or swallowing no trauma.   Hypertension      Home Medications Prior to Admission medications   Medication Sig Start Date End Date Taking? Authorizing Provider  doxycycline (VIBRAMYCIN) 100 MG capsule Take 1 capsule (100 mg total) by mouth 2 (two) times daily. One po bid x 7 days 02/19/22  Yes Melene Plan, DO  atorvastatin (LIPITOR) 80 MG tablet Take 1 tablet (80 mg total) by mouth daily. 01/03/22   Revankar, Aundra Dubin, MD  fluticasone (FLONASE) 50 MCG/ACT nasal spray Place 2 sprays into both nostrils as needed for rhinitis. 10/05/20   [provider]  losartan-hydrochlorothiazide (HYZAAR) 50-12.5 MG tablet Take 1 tablet by mouth daily.     [provider]  potassium chloride SA (KLOR-CON M) 20 MEQ tablet Take 1 tablet (20 mEq total) by mouth 2 (two) times daily for 5 days. 09/23/21 09/28/21  Haskel Schroeder, PA-C  sertraline (ZOLOFT) 100 MG tablet Take 100 mg by mouth daily.     [provider]      Allergies    Lisinopril and Penicillins    Review of Systems   Review of Systems  Physical Exam Updated Vital Signs BP (!) 174/90   Pulse 94   Temp 98.5 F (36.9 C) (Oral)   Resp 20   Ht 5\' 4"  (1.626 m)   Wt 72.6 kg   SpO2 100%   BMI 27.46 kg/m  Physical Exam Vitals and nursing note  reviewed.  Constitutional:      General: She is not in acute distress.    Appearance: She is well-developed. She is not diaphoretic.  HENT:     Head: Normocephalic and atraumatic.     Comments: Swollen turbinates, posterior nasal drip, no noted sinus ttp, tm normal bilaterally.   Eyes:     Pupils: Pupils are equal, round, and reactive to light.  Cardiovascular:     Rate and Rhythm: Normal rate and regular rhythm.     Heart sounds: No murmur heard.   No friction rub. No gallop.  Pulmonary:     Effort: Pulmonary effort is normal.     Breath sounds: No wheezing or rales.  Abdominal:     General: There is no distension.     Palpations: Abdomen is soft.     Tenderness: There is no abdominal tenderness.  Musculoskeletal:        General: No tenderness.     Cervical back: Normal range of motion and neck supple.  Skin:    General: Skin is warm and dry.  Neurological:     Mental Status: She is alert and oriented to person, place, and time.     Cranial Nerves: Cranial nerves 2-12 are intact.  Sensory: Sensation is intact.     Motor: Motor function is intact.     Coordination: Coordination is intact.     Comments: Benign neuro exam  Psychiatric:        Behavior: Behavior normal.    ED Results / Procedures / Treatments   Labs (all labs ordered are listed, but only abnormal results are displayed) Labs Reviewed - No data to display  EKG None  Radiology No results found.  Procedures Procedures    Medications Ordered in ED Medications  doxycycline (VIBRA-TABS) tablet 100 mg (100 mg Oral Given 02/19/22 1251)    ED Course/ Medical Decision Making/ A&P                           Medical Decision Making Risk Prescription drug management.   71 yo F with a cc of frontal headache.  This been going on since yesterday.  Patient has signs of sinusitis clinically.  I discussed this with her and she tells me that she does get frequent sinus infections.  She is describing a  whooshing noise when she leans her head severely to the right.  We discussed risk and benefits of CT imaging to evaluate for aneurysm.  Eventually she decided that she would prefer to go on antibiotics and follow-up with her doctor.  Is also leaving the room she did mention that every time she does have a sinus infection she tends to have that whooshing.  12:52 PM:  I have discussed the diagnosis/risks/treatment options with the patient.  Evaluation and diagnostic testing in the emergency department does not suggest an emergent condition requiring admission or immediate intervention beyond what has been performed at this time.  They will follow up with  PCP. We also discussed returning to the ED immediately if new or worsening sx occur. We discussed the sx which are most concerning (e.g., sudden worsening pain, fever, inability to tolerate by mouth) that necessitate immediate return. Medications administered to the patient during their visit and any new prescriptions provided to the patient are listed below.  Medications given during this visit Medications  doxycycline (VIBRA-TABS) tablet 100 mg (100 mg Oral Given 02/19/22 1251)     The patient appears reasonably screen and/or stabilized for discharge and I doubt any other medical condition or other [facility] requiring further screening, evaluation, or treatment in the ED at this time prior to discharge.          Final Clinical Impression(s) / ED Diagnoses Final diagnoses:  Acute recurrent frontal sinusitis    Rx / DC Orders ED Discharge Orders          Ordered    doxycycline (VIBRAMYCIN) 100 MG capsule  2 times daily        02/19/22 1240              [place], DO 02/19/22 1252

## 2022-02-19 NOTE — Discharge Instructions (Signed)
Take the antibiotics as prescribed.  You can take Tylenol as needed for your headaches.  Please follow-up with your family doctor in the office.  Please return for worsening pain one-sided numbness or weakness difficulty with speech or swallowing.

## 2022-03-22 DIAGNOSIS — R600 Localized edema: Secondary | ICD-10-CM | POA: Diagnosis not present

## 2022-03-22 DIAGNOSIS — E559 Vitamin D deficiency, unspecified: Secondary | ICD-10-CM | POA: Diagnosis not present

## 2022-03-22 DIAGNOSIS — J069 Acute upper respiratory infection, unspecified: Secondary | ICD-10-CM | POA: Diagnosis not present

## 2022-03-22 DIAGNOSIS — Z6830 Body mass index (BMI) 30.0-30.9, adult: Secondary | ICD-10-CM | POA: Diagnosis not present

## 2022-03-22 DIAGNOSIS — I10 Essential (primary) hypertension: Secondary | ICD-10-CM | POA: Diagnosis not present

## 2022-03-22 DIAGNOSIS — Z79899 Other long term (current) drug therapy: Secondary | ICD-10-CM | POA: Diagnosis not present

## 2022-03-22 DIAGNOSIS — E119 Type 2 diabetes mellitus without complications: Secondary | ICD-10-CM | POA: Diagnosis not present

## 2022-03-22 DIAGNOSIS — F419 Anxiety disorder, unspecified: Secondary | ICD-10-CM | POA: Diagnosis not present

## 2022-03-22 DIAGNOSIS — N950 Postmenopausal bleeding: Secondary | ICD-10-CM | POA: Diagnosis not present

## 2022-03-22 DIAGNOSIS — E785 Hyperlipidemia, unspecified: Secondary | ICD-10-CM | POA: Diagnosis not present

## 2022-03-30 ENCOUNTER — Other Ambulatory Visit: Payer: Self-pay

## 2022-03-30 ENCOUNTER — Emergency Department (HOSPITAL_BASED_OUTPATIENT_CLINIC_OR_DEPARTMENT_OTHER)
Admission: EM | Admit: 2022-03-30 | Discharge: 2022-03-30 | Disposition: A | Discharge status: Home / Self Care | Payer: Medicare HMO | Attending: Emergency Medicine | Admitting: Emergency Medicine

## 2022-03-30 ENCOUNTER — Encounter (HOSPITAL_BASED_OUTPATIENT_CLINIC_OR_DEPARTMENT_OTHER): Payer: Self-pay | Admitting: *Deleted

## 2022-03-30 DIAGNOSIS — I10 Essential (primary) hypertension: Secondary | ICD-10-CM | POA: Diagnosis not present

## 2022-03-30 DIAGNOSIS — Z79899 Other long term (current) drug therapy: Secondary | ICD-10-CM | POA: Insufficient documentation

## 2022-03-30 NOTE — ED Notes (Signed)
ED Provider at bedside. 

## 2022-03-30 NOTE — ED Triage Notes (Signed)
Here for evaluation for "my blood pressure being up", approx 1 week. Denies vision issues, HA, dizziness. Speech wnl. BEFAST / VAN Negative

## 2022-03-30 NOTE — Discharge Instructions (Addendum)
Keep taking your blood pressure in the morning and at night.  Take your medication as prescribed as well.  Read the information about hypertension attached to these papers for more information about when to return to the emergency department.  These signs include:  Chest pain Headaches Blurred vision Difficulty breathing Severe back pain Confusion, weakness or slurred speech.  It was a pleasure to meet you and I hope you feel better.

## 2022-03-30 NOTE — ED Provider Notes (Signed)
[facility] EMERGENCY DEPARTMENT Provider Note   CSN: [id number] Arrival date & time: 03/30/22  1441     History  Chief Complaint  Patient presents with   Hypertension    Heather Woods is a 71 y.o. female with a past medical history of hypertension on Hyzaar presenting today with elevated blood pressure reading.  She reports that her blood pressure at home this morning when 187/101.  This was taken prior to taking her morning Hyzaar.  She is being followed by PCP for recently diagnosed hypertension and last week her Hyzaar dose was doubled.  She has been keeping a record of her blood pressures at home that have been normal until this morning.  Does endorse eating large amounts of wings and other salty foods for 4 July yesterday.  No visual disturbance, headache, weakness, slurred speech, chest pain, back pain, shortness of breath or symptoms outside of her elevated pressure.   Hypertension       Home Medications Prior to Admission medications   Medication Sig Start Date End Date Taking? Authorizing Provider  atorvastatin (LIPITOR) 80 MG tablet Take 1 tablet (80 mg total) by mouth daily. 01/03/22   Revankar, Aundra Dubin, MD  doxycycline (VIBRAMYCIN) 100 MG capsule Take 1 capsule (100 mg total) by mouth 2 (two) times daily. One po bid x 7 days 02/19/22   Melene Plan, DO  fluticasone [facility]) 50 MCG/ACT nasal spray Place 2 sprays into both nostrils as needed for rhinitis. 10/05/20   [provider]  losartan-hydrochlorothiazide (HYZAAR) 50-12.5 MG tablet Take 1 tablet by mouth daily.     [provider]  potassium chloride SA (KLOR-CON M) 20 MEQ tablet Take 1 tablet (20 mEq total) by mouth 2 (two) times daily for 5 days. 09/23/21 09/28/21  Haskel Schroeder, PA-C  sertraline (ZOLOFT) 100 MG tablet Take 100 mg by mouth daily.     [provider]      Allergies    Lisinopril and Penicillins    Review of Systems   Review of Systems  Physical  Exam Updated Vital Signs BP (!) 166/91 (BP Location: Right Arm)   Pulse 84   Temp 98.2 F (36.8 C) (Oral)   Resp 18   Ht 5\' 4"  (1.626 m)   Wt 73 kg   SpO2 100%   BMI 27.62 kg/m  Physical Exam Vitals and nursing note reviewed.  Constitutional:      Appearance: Normal appearance.  HENT:     Head: Normocephalic and atraumatic.  Eyes:     General: No scleral icterus.    Conjunctiva/sclera: Conjunctivae normal.  Pulmonary:     Effort: Pulmonary effort is normal. No respiratory distress.  Skin:    Findings: No rash.  Neurological:     Mental Status: She is alert.     Cranial Nerves: No cranial nerve deficit.     Sensory: No sensory deficit.     Coordination: Coordination normal.     Gait: Gait normal.     Comments: Cranial nerves II through XII grossly intact.  PERRLA.  EOMs intact and no problems with finger-nose.  Ambulatory.  Psychiatric:        Mood and Affect: Mood normal.     ED Results / Procedures / Treatments   Labs (all labs ordered are listed, but only abnormal results are displayed) Labs Reviewed - No data to display  EKG None  Radiology No results found.  Procedures Procedures   Medications Ordered in ED Medications -  No data to display  ED Course/ Medical Decision Making/ A&P Clinical Course as of 03/30/22 1628  Wed Mar 30, 2022  1625 HBP: 138/74 at bedside [MR]    Clinical Course User Index [MR] Tayler Heiden, Gabriel Cirri, PA-C                           Medical Decision Making  71 year old female presenting today with complaint of hypertension.  No symptoms or signs of hypertensive urgency or emergency.  Believes it was triggered by increased sodium in her diet yesterday.  Physical exam: Within normal limits.  No sign of CVA or neurologic compromise.  No signs or symptoms of ACS.  Work-up: Lab work, EKG, chest x-ray and CT head were considered however patient does not have any symptoms concerning me for endorgan disease  Treatment: Considered  antihypertensive however when I took the patient's blood pressure at bedside it was 130s/70s.  Her primary care is following her pressures closely and I do not believe she needs another antihypertensive in the mix at this time.    MDM/disposition: Patient shows no signs of hypertensive urgency or emergency.  Do not believe she has an emergent condition that requires intervention today.  She will be discharged home to continue her dietary and lower sodium diet. She is agreeable to this plan.  Given information about HTN emergencies  Final Clinical Impression(s) / ED Diagnoses Final diagnoses:  Hypertension, unspecified type    Rx / DC Orders   Results and diagnoses were explained to the patient. Return precautions discussed in full. Patient had no additional questions and expressed complete understanding.   This chart was dictated using voice recognition software.  Despite best efforts to proofread,  errors can occur which can change the documentation meaning.     Saddie Benders, PA-C 03/30/22 1634    Alvira Monday, MD 04/01/22 1351

## 2022-04-01 DIAGNOSIS — Z79899 Other long term (current) drug therapy: Secondary | ICD-10-CM | POA: Diagnosis not present

## 2022-04-01 DIAGNOSIS — F411 Generalized anxiety disorder: Secondary | ICD-10-CM | POA: Diagnosis not present

## 2022-04-01 DIAGNOSIS — F331 Major depressive disorder, recurrent, moderate: Secondary | ICD-10-CM | POA: Diagnosis not present

## 2022-04-20 DIAGNOSIS — F419 Anxiety disorder, unspecified: Secondary | ICD-10-CM | POA: Diagnosis not present

## 2022-04-20 DIAGNOSIS — Z09 Encounter for follow-up examination after completed treatment for conditions other than malignant neoplasm: Secondary | ICD-10-CM | POA: Diagnosis not present

## 2022-04-20 DIAGNOSIS — I10 Essential (primary) hypertension: Secondary | ICD-10-CM | POA: Diagnosis not present

## 2022-04-20 DIAGNOSIS — Z6829 Body mass index (BMI) 29.0-29.9, adult: Secondary | ICD-10-CM | POA: Diagnosis not present

## 2022-04-20 DIAGNOSIS — F330 Major depressive disorder, recurrent, mild: Secondary | ICD-10-CM | POA: Diagnosis not present

## 2022-04-20 DIAGNOSIS — J309 Allergic rhinitis, unspecified: Secondary | ICD-10-CM | POA: Diagnosis not present

## 2022-04-20 DIAGNOSIS — I700 Atherosclerosis of aorta: Secondary | ICD-10-CM | POA: Diagnosis not present

## 2022-04-20 DIAGNOSIS — E1169 Type 2 diabetes mellitus with other specified complication: Secondary | ICD-10-CM | POA: Diagnosis not present

## 2022-05-20 ENCOUNTER — Ambulatory Visit: Payer: Medicare HMO | Admitting: Cardiology

## 2022-06-27 DIAGNOSIS — Z79899 Other long term (current) drug therapy: Secondary | ICD-10-CM | POA: Diagnosis not present

## 2022-06-27 DIAGNOSIS — F331 Major depressive disorder, recurrent, moderate: Secondary | ICD-10-CM | POA: Diagnosis not present

## 2022-06-27 DIAGNOSIS — F411 Generalized anxiety disorder: Secondary | ICD-10-CM | POA: Diagnosis not present

## 2022-07-07 DIAGNOSIS — E785 Hyperlipidemia, unspecified: Secondary | ICD-10-CM | POA: Diagnosis not present

## 2022-07-07 DIAGNOSIS — H6123 Impacted cerumen, bilateral: Secondary | ICD-10-CM | POA: Diagnosis not present

## 2022-07-07 DIAGNOSIS — R600 Localized edema: Secondary | ICD-10-CM | POA: Diagnosis not present

## 2022-07-07 DIAGNOSIS — E559 Vitamin D deficiency, unspecified: Secondary | ICD-10-CM | POA: Diagnosis not present

## 2022-07-07 DIAGNOSIS — E119 Type 2 diabetes mellitus without complications: Secondary | ICD-10-CM | POA: Diagnosis not present

## 2022-07-07 DIAGNOSIS — N950 Postmenopausal bleeding: Secondary | ICD-10-CM | POA: Diagnosis not present

## 2022-07-07 DIAGNOSIS — J309 Allergic rhinitis, unspecified: Secondary | ICD-10-CM | POA: Diagnosis not present

## 2022-07-07 DIAGNOSIS — I10 Essential (primary) hypertension: Secondary | ICD-10-CM | POA: Diagnosis not present

## 2022-07-07 DIAGNOSIS — Z79899 Other long term (current) drug therapy: Secondary | ICD-10-CM | POA: Diagnosis not present

## 2022-07-09 ENCOUNTER — Other Ambulatory Visit: Payer: Self-pay

## 2022-07-09 ENCOUNTER — Emergency Department (HOSPITAL_BASED_OUTPATIENT_CLINIC_OR_DEPARTMENT_OTHER)
Admission: EM | Admit: 2022-07-09 | Discharge: 2022-07-09 | Disposition: A | Discharge status: Home / Self Care | Payer: Medicare HMO | Attending: Emergency Medicine | Admitting: Emergency Medicine

## 2022-07-09 ENCOUNTER — Encounter (HOSPITAL_BASED_OUTPATIENT_CLINIC_OR_DEPARTMENT_OTHER): Payer: Self-pay | Admitting: *Deleted

## 2022-07-09 DIAGNOSIS — R350 Frequency of micturition: Secondary | ICD-10-CM | POA: Insufficient documentation

## 2022-07-09 DIAGNOSIS — E119 Type 2 diabetes mellitus without complications: Secondary | ICD-10-CM | POA: Diagnosis not present

## 2022-07-09 DIAGNOSIS — I10 Essential (primary) hypertension: Secondary | ICD-10-CM | POA: Diagnosis not present

## 2022-07-09 DIAGNOSIS — F419 Anxiety disorder, unspecified: Secondary | ICD-10-CM | POA: Diagnosis not present

## 2022-07-09 DIAGNOSIS — E785 Hyperlipidemia, unspecified: Secondary | ICD-10-CM | POA: Insufficient documentation

## 2022-07-09 LAB — CBC WITH DIFFERENTIAL/PLATELET
Abs Immature Granulocytes: 0.03 10*3/uL (ref 0.00–0.07)
Basophils Absolute: 0 10*3/uL (ref 0.0–0.1)
Basophils Relative: 0 %
Eosinophils Absolute: 0.1 10*3/uL (ref 0.0–0.5)
Eosinophils Relative: 1 %
HCT: 38.9 % (ref 36.0–46.0)
Hemoglobin: 12.8 g/dL (ref 12.0–15.0)
Immature Granulocytes: 0 %
Lymphocytes Relative: 27 %
Lymphs Abs: 2.5 10*3/uL (ref 0.7–4.0)
MCH: 27.6 pg (ref 26.0–34.0)
MCHC: 32.9 g/dL (ref 30.0–36.0)
MCV: 83.8 fL (ref 80.0–100.0)
Monocytes Absolute: 0.8 10*3/uL (ref 0.1–1.0)
Monocytes Relative: 8 %
Neutro Abs: 5.9 10*3/uL (ref 1.7–7.7)
Neutrophils Relative %: 64 %
Platelets: 284 10*3/uL (ref 150–400)
RBC: 4.64 MIL/uL (ref 3.87–5.11)
RDW: 13.4 % (ref 11.5–15.5)
WBC: 9.3 10*3/uL (ref 4.0–10.5)
nRBC: 0 % (ref 0.0–0.2)

## 2022-07-09 LAB — URINALYSIS, ROUTINE W REFLEX MICROSCOPIC
Bilirubin Urine: NEGATIVE
Glucose, UA: NEGATIVE mg/dL
Hgb urine dipstick: NEGATIVE
Ketones, ur: NEGATIVE mg/dL
Leukocytes,Ua: NEGATIVE
Nitrite: NEGATIVE
Protein, ur: NEGATIVE mg/dL
Specific Gravity, Urine: 1.01 (ref 1.005–1.030)
pH: 5.5 (ref 5.0–8.0)

## 2022-07-09 LAB — WET PREP, GENITAL
Clue Cells Wet Prep HPF POC: NONE SEEN
Sperm: NONE SEEN
Trich, Wet Prep: NONE SEEN
WBC, Wet Prep HPF POC: >=10 — AB (ref <10)
Yeast Wet Prep HPF POC: NONE SEEN

## 2022-07-09 LAB — BASIC METABOLIC PANEL
Anion gap: 7 (ref 5–15)
BUN: 23 mg/dL (ref 8–23)
CO2: 24 mmol/L (ref 22–32)
Calcium: 9.4 mg/dL (ref 8.9–10.3)
Chloride: 107 mmol/L (ref 98–111)
Creatinine, Ser: 0.87 mg/dL (ref 0.44–1.00)
GFR, Estimated: >60 mL/min (ref >60)
Glucose, Bld: 99 mg/dL (ref 70–99)
Potassium: 3.4 mmol/L — ABNORMAL LOW (ref 3.5–5.1)
Sodium: 138 mmol/L (ref 135–145)

## 2022-07-09 MED ORDER — LOSARTAN POTASSIUM 50 MG PO TABS: 50.0000 mg | ORAL_TABLET | Freq: Every day | ORAL | 0 refills | Status: DC

## 2022-07-09 NOTE — Discharge Instructions (Addendum)
I have changed your blood pressure medication so you can be on plain Losartan without the HCTZ diuretic.

## 2022-07-09 NOTE — ED Provider Notes (Signed)
[facility] EMERGENCY DEPARTMENT Provider Note   CSN: [id number] Arrival date & time: 07/09/22  2706     History  Chief Complaint  Patient presents with   Urinary Frequency    Heather Woods is a 71 y.o. female.  Pt is a 71 yo female with a pmhx significant for HLD, HTN, anxiety, and DM.  Pt said she has had increased frequency of urination for the past few days.  She is on a diuretic, but has been on it for a long time.  Pt denies any other sx.  Pt denies f/c.         Home Medications Prior to Admission medications   Medication Sig Start Date End Date Taking? Authorizing Provider  losartan (COZAAR) 50 MG tablet Take 1 tablet (50 mg total) by mouth daily. 07/09/22  Yes Jacalyn Lefevre, MD  atorvastatin (LIPITOR) 80 MG tablet Take 1 tablet (80 mg total) by mouth daily. 01/03/22   Revankar, Aundra Dubin, MD  doxycycline (VIBRAMYCIN) 100 MG capsule Take 1 capsule (100 mg total) by mouth 2 (two) times daily. One po bid x 7 days 02/19/22   Melene Plan, DO  fluticasone [facility]) 50 MCG/ACT nasal spray Place 2 sprays into both nostrils as needed for rhinitis. 10/05/20   [provider]  potassium chloride SA (KLOR-CON M) 20 MEQ tablet Take 1 tablet (20 mEq total) by mouth 2 (two) times daily for 5 days. 09/23/21 09/28/21  Haskel Schroeder, PA-C  sertraline (ZOLOFT) 100 MG tablet Take 100 mg by mouth daily.     [provider]      Allergies    Lisinopril and Penicillins    Review of Systems   Review of Systems  Genitourinary:  Positive for dysuria and frequency.  All other systems reviewed and are negative.   Physical Exam Updated Vital Signs BP (!) 151/88   Pulse 65   Temp 98.6 F (37 C) (Oral)   Resp 16   Ht 5\' 4"  (1.626 m)   Wt 73.9 kg   SpO2 97%   BMI 27.98 kg/m  Physical Exam Vitals and nursing note reviewed. Exam conducted with a chaperone present.  Constitutional:      Appearance: Normal appearance.  HENT:     Head: Normocephalic and  atraumatic.     Right Ear: External ear normal.     Left Ear: External ear normal.     Nose: Nose normal.     Mouth/Throat:     Mouth: Mucous membranes are moist.     Pharynx: Oropharynx is clear.  Eyes:     Extraocular Movements: Extraocular movements intact.     Conjunctiva/sclera: Conjunctivae normal.     Pupils: Pupils are equal, round, and reactive to light.  Cardiovascular:     Rate and Rhythm: Normal rate and regular rhythm.     Pulses: Normal pulses.     Heart sounds: Normal heart sounds.  Pulmonary:     Effort: Pulmonary effort is normal.     Breath sounds: Normal breath sounds.  Abdominal:     General: Abdomen is flat. Bowel sounds are normal.     Palpations: Abdomen is soft.  Genitourinary:    General: Normal vulva.     Exam position: Lithotomy position.     Cervix: Normal.     Uterus: Normal.      Adnexa: Right adnexa normal.  Musculoskeletal:        General: Normal range of motion.     Cervical back:  Normal range of motion and neck supple.  Skin:    General: Skin is warm.     Capillary Refill: Capillary refill takes less than 2 seconds.  Neurological:     General: No focal deficit present.     Mental Status: She is alert and oriented to person, place, and time.  Psychiatric:        Mood and Affect: Mood normal.        Behavior: Behavior normal.     ED Results / Procedures / Treatments   Labs (all labs ordered are listed, but only abnormal results are displayed) Labs Reviewed  WET PREP, GENITAL - Abnormal; Notable for the following components:      Result Value   WBC, Wet Prep HPF POC >=10 (*)    All other components within normal limits  URINALYSIS, ROUTINE W REFLEX MICROSCOPIC - Abnormal; Notable for the following components:   Color, Urine STRAW (*)    All other components within normal limits  BASIC METABOLIC PANEL - Abnormal; Notable for the following components:   Potassium 3.4 (*)    All other components within normal limits  CBC WITH  DIFFERENTIAL/PLATELET  GC/CHLAMYDIA PROBE AMP ([place]) NOT AT [facility]    EKG None  Radiology No results found.  Procedures Procedures    Medications Ordered in ED Medications - No data to display  ED Course/ Medical Decision Making/ A&P                           Medical Decision Making Amount and/or Complexity of Data Reviewed Labs: ordered.  Risk Prescription drug management.   This patient presents to the ED for concern of urinary frequency, this involves an extensive number of treatment options, and is a complaint that carries with it a high risk of complications and morbidity.  The differential diagnosis includes UTI, hyperglycemia, diuretic use   Co morbidities that complicate the patient evaluation  HLD, HTN, anxiety, and DM   Additional history obtained:  Additional history obtained from epic chart review  Lab Tests:  I Ordered, and personally interpreted labs.  The pertinent results include:  ua nl, cbc nl, bmp nl other than mildly decreased K at 3.4.  ; wet prep neg  Cardiac Monitoring:  The patient was maintained on a cardiac monitor.  I personally viewed and interpreted the cardiac monitored which showed an underlying rhythm of: nsr   Medicines ordered and prescription drug management:   I have reviewed the patients home medicines and have made adjustments as needed  Problem List / ED Course:  Urinary frequency:  urine nl.  Pt is on a bp med with hctz in it.  I am going to change her bp med to be just losartan   Reevaluation:  After the interventions noted above, I reevaluated the patient and found that they have :improved   Social Determinants of Health:  Lives at home   Dispostion:  After consideration of the diagnostic results and the patients response to treatment, I feel that the patent would benefit from discharge with outpatient tx.          Final Clinical Impression(s) / ED Diagnoses Final diagnoses:  Urinary  frequency    Rx / DC Orders ED Discharge Orders          Ordered    losartan (COZAAR) 50 MG tablet  Daily        07/09/22 2035  Jacalyn Lefevre, MD 07/09/22 2052

## 2022-07-09 NOTE — ED Triage Notes (Signed)
Pt is here for frequent urination since last week, pt was seen by PCP and had urine checked but has not been informed of the results yet.  Pt reports that that she has burning and continued frequency.  No fever or chills.

## 2022-07-11 LAB — GC/CHLAMYDIA PROBE AMP (~~LOC~~) NOT AT ARMC
Chlamydia: NEGATIVE
Comment: NEGATIVE
Comment: NORMAL
Neisseria Gonorrhea: NEGATIVE

## 2022-07-13 DIAGNOSIS — E876 Hypokalemia: Secondary | ICD-10-CM | POA: Diagnosis not present

## 2022-07-13 DIAGNOSIS — E559 Vitamin D deficiency, unspecified: Secondary | ICD-10-CM | POA: Diagnosis not present

## 2022-07-13 DIAGNOSIS — R350 Frequency of micturition: Secondary | ICD-10-CM | POA: Diagnosis not present

## 2022-07-13 DIAGNOSIS — F419 Anxiety disorder, unspecified: Secondary | ICD-10-CM | POA: Diagnosis not present

## 2022-07-13 DIAGNOSIS — Z6829 Body mass index (BMI) 29.0-29.9, adult: Secondary | ICD-10-CM | POA: Diagnosis not present

## 2022-07-13 DIAGNOSIS — F330 Major depressive disorder, recurrent, mild: Secondary | ICD-10-CM | POA: Diagnosis not present

## 2022-07-13 DIAGNOSIS — I10 Essential (primary) hypertension: Secondary | ICD-10-CM | POA: Diagnosis not present

## 2022-08-04 DIAGNOSIS — Z6830 Body mass index (BMI) 30.0-30.9, adult: Secondary | ICD-10-CM | POA: Diagnosis not present

## 2022-08-04 DIAGNOSIS — R300 Dysuria: Secondary | ICD-10-CM | POA: Diagnosis not present

## 2022-08-04 DIAGNOSIS — N3946 Mixed incontinence: Secondary | ICD-10-CM | POA: Diagnosis not present

## 2022-08-04 DIAGNOSIS — N760 Acute vaginitis: Secondary | ICD-10-CM | POA: Diagnosis not present

## 2022-08-09 DIAGNOSIS — F419 Anxiety disorder, unspecified: Secondary | ICD-10-CM | POA: Diagnosis not present

## 2022-08-11 ENCOUNTER — Other Ambulatory Visit: Payer: Self-pay

## 2022-08-11 DIAGNOSIS — R413 Other amnesia: Secondary | ICD-10-CM | POA: Diagnosis not present

## 2022-08-11 DIAGNOSIS — I10 Essential (primary) hypertension: Secondary | ICD-10-CM | POA: Diagnosis not present

## 2022-08-11 DIAGNOSIS — N3946 Mixed incontinence: Secondary | ICD-10-CM | POA: Diagnosis not present

## 2022-08-11 DIAGNOSIS — Z6830 Body mass index (BMI) 30.0-30.9, adult: Secondary | ICD-10-CM | POA: Diagnosis not present

## 2022-08-11 DIAGNOSIS — J302 Other seasonal allergic rhinitis: Secondary | ICD-10-CM | POA: Diagnosis not present

## 2022-08-12 ENCOUNTER — Emergency Department (HOSPITAL_BASED_OUTPATIENT_CLINIC_OR_DEPARTMENT_OTHER): Payer: Medicare HMO

## 2022-08-12 ENCOUNTER — Encounter (HOSPITAL_BASED_OUTPATIENT_CLINIC_OR_DEPARTMENT_OTHER): Payer: Self-pay | Admitting: Emergency Medicine

## 2022-08-12 ENCOUNTER — Emergency Department (HOSPITAL_BASED_OUTPATIENT_CLINIC_OR_DEPARTMENT_OTHER)
Admission: EM | Admit: 2022-08-12 | Discharge: 2022-08-12 | Disposition: A | Discharge status: Home / Self Care | Payer: Medicare HMO | Attending: Emergency Medicine | Admitting: Emergency Medicine

## 2022-08-12 ENCOUNTER — Other Ambulatory Visit: Payer: Self-pay

## 2022-08-12 DIAGNOSIS — R519 Headache, unspecified: Secondary | ICD-10-CM | POA: Diagnosis not present

## 2022-08-12 DIAGNOSIS — I10 Essential (primary) hypertension: Secondary | ICD-10-CM | POA: Diagnosis not present

## 2022-08-12 DIAGNOSIS — Z79899 Other long term (current) drug therapy: Secondary | ICD-10-CM | POA: Diagnosis not present

## 2022-08-12 LAB — CBC WITH DIFFERENTIAL/PLATELET
Abs Immature Granulocytes: 0.03 10*3/uL (ref 0.00–0.07)
Basophils Absolute: 0 10*3/uL (ref 0.0–0.1)
Basophils Relative: 0 %
Eosinophils Absolute: 0.1 10*3/uL (ref 0.0–0.5)
Eosinophils Relative: 1 %
HCT: 37.2 % (ref 36.0–46.0)
Hemoglobin: 11.9 g/dL — ABNORMAL LOW (ref 12.0–15.0)
Immature Granulocytes: 0 %
Lymphocytes Relative: 26 %
Lymphs Abs: 2 10*3/uL (ref 0.7–4.0)
MCH: 27.7 pg (ref 26.0–34.0)
MCHC: 32 g/dL (ref 30.0–36.0)
MCV: 86.5 fL (ref 80.0–100.0)
Monocytes Absolute: 0.5 10*3/uL (ref 0.1–1.0)
Monocytes Relative: 7 %
Neutro Abs: 4.9 10*3/uL (ref 1.7–7.7)
Neutrophils Relative %: 66 %
Platelets: 272 10*3/uL (ref 150–400)
RBC: 4.3 MIL/uL (ref 3.87–5.11)
RDW: 13.3 % (ref 11.5–15.5)
WBC: 7.6 10*3/uL (ref 4.0–10.5)
nRBC: 0 % (ref 0.0–0.2)

## 2022-08-12 LAB — URINALYSIS, ROUTINE W REFLEX MICROSCOPIC
Bilirubin Urine: NEGATIVE
Glucose, UA: NEGATIVE mg/dL
Hgb urine dipstick: NEGATIVE
Ketones, ur: NEGATIVE mg/dL
Nitrite: NEGATIVE
Protein, ur: NEGATIVE mg/dL
Specific Gravity, Urine: 1.025 (ref 1.005–1.030)
pH: 5.5 (ref 5.0–8.0)

## 2022-08-12 LAB — URINALYSIS, MICROSCOPIC (REFLEX): RBC / HPF: NONE SEEN RBC/hpf (ref 0–5)

## 2022-08-12 LAB — BASIC METABOLIC PANEL
Anion gap: 6 (ref 5–15)
BUN: 15 mg/dL (ref 8–23)
CO2: 28 mmol/L (ref 22–32)
Calcium: 9.3 mg/dL (ref 8.9–10.3)
Chloride: 107 mmol/L (ref 98–111)
Creatinine, Ser: 0.94 mg/dL (ref 0.44–1.00)
GFR, Estimated: >60 mL/min (ref >60)
Glucose, Bld: 100 mg/dL — ABNORMAL HIGH (ref 70–99)
Potassium: 4.2 mmol/L (ref 3.5–5.1)
Sodium: 141 mmol/L (ref 135–145)

## 2022-08-12 MED ORDER — KETOROLAC TROMETHAMINE 15 MG/ML IJ SOLN
15.0000 mg | Freq: Once | INTRAMUSCULAR | Status: AC
  Administered 2022-08-12: 15 mg via INTRAVENOUS
  Filled 2022-08-12: qty 1

## 2022-08-12 MED ORDER — SODIUM CHLORIDE 0.9 % IV BOLUS
1000.0000 mL | Freq: Once | INTRAVENOUS | Status: AC
  Administered 2022-08-12: 1000 mL via INTRAVENOUS

## 2022-08-12 MED ORDER — LOSARTAN POTASSIUM 100 MG PO TABS: 100.0000 mg | ORAL_TABLET | Freq: Every day | ORAL | 0 refills | Status: AC

## 2022-08-12 NOTE — Discharge Instructions (Addendum)
It was a pleasure taking care of you today!   Your CT of your head and your labs didn't show any concerning findings today. Your labs were unremarkable today. You will be sent a prescription for your 100 mg losartan, take as directed starting tomorrow.  Ensure to keep a log of your blood pressures.  Maintain your follow-up appointment with your cardiologist as scheduled on 08/15/2022.  The rotator cuff over-the-counter 500 g Tylenol every 6 hours and alternate with 600 mg ibuprofen every 6 hours should your headache persist.  You may follow-up with your primary care provider as needed regarding today's ED visit.  Return to the emergency room if you are experiencing increasing/worsening symptoms.

## 2022-08-12 NOTE — ED Provider Notes (Signed)
[facility] EMERGENCY DEPARTMENT Provider Note   CSN: [id number] Arrival date & time: 08/12/22  1327     History  Chief Complaint  Patient presents with   Hypertension    Heather Woods is a 71 y.o. female with a past medical history of hypertension who presents to the emergency department with concerns for elevated blood pressure onset 1 day. Notes that her blood pressure was initially 200 systolically PTA. Had her medications changed recently. Has associated right sided headaches. No meds tried PTA. No chest pain, shortness of breath, abdominal pain, nausea, vomiting, urinary symptoms, blurred vision, double vision.    The history is provided by the patient. No language interpreter was used.       Home Medications Prior to Admission medications   Medication Sig Start Date End Date Taking? Authorizing Provider  atorvastatin (LIPITOR) 80 MG tablet Take 1 tablet (80 mg total) by mouth daily. 01/03/22   Revankar, Aundra Dubin, MD  doxycycline (VIBRAMYCIN) 100 MG capsule Take 1 capsule (100 mg total) by mouth 2 (two) times daily. One po bid x 7 days 02/19/22   Melene Plan, DO  fluticasone [facility]) 50 MCG/ACT nasal spray Place 2 sprays into both nostrils as needed for rhinitis. 10/05/20   [provider]  losartan (COZAAR) 50 MG tablet Take 1 tablet (50 mg total) by mouth daily. 07/09/22   Jacalyn Lefevre, MD  losartan-hydrochlorothiazide (HYZAAR) 100-25 MG tablet Take 1 tablet by mouth daily. 07/07/22   [provider]  potassium chloride SA (KLOR-CON M) 20 MEQ tablet Take 1 tablet (20 mEq total) by mouth 2 (two) times daily for 5 days. 09/23/21 09/28/21  Haskel Schroeder, PA-C  rosuvastatin (CRESTOR) 20 MG tablet Take 20 mg by mouth daily. 07/05/22   [provider]  sertraline (ZOLOFT) 100 MG tablet Take 100 mg by mouth daily.     [provider]  Vitamin D, Ergocalciferol, (DRISDOL) 1.25 MG (50000 UNIT) CAPS capsule Take 50,000 Units by mouth  once a week. 07/05/22   [provider]      Allergies    Lisinopril and Penicillins    Review of Systems   Review of Systems  All other systems reviewed and are negative.   Physical Exam Updated Vital Signs BP (!) 167/96   Pulse 63   Temp 98.6 F (37 C)   Resp 18   Ht 5\' 3"  (1.6 m)   Wt 72.6 kg   SpO2 98%   BMI 28.34 kg/m  Physical Exam Vitals and nursing note reviewed.  Constitutional:      General: She is not in acute distress.    Appearance: Normal appearance.  Eyes:     General: No scleral icterus.    Extraocular Movements: Extraocular movements intact.  Cardiovascular:     Rate and Rhythm: Normal rate and regular rhythm.     Pulses: Normal pulses.     Heart sounds: Normal heart sounds.  Pulmonary:     Effort: Pulmonary effort is normal. No respiratory distress.     Breath sounds: Normal breath sounds.  Abdominal:     Palpations: Abdomen is soft. There is no mass.     Tenderness: There is no abdominal tenderness.  Musculoskeletal:        General: Normal range of motion.     Cervical back: Neck supple.  Skin:    General: Skin is warm and dry.     Findings: No rash.  Neurological:     Mental Status: She  is alert.     Sensory: Sensation is intact.     Motor: Motor function is intact.     Comments: No focal neuro deficits. Negative pronator drift. Able to ambulate without assistance or difficulty.  Psychiatric:        Behavior: Behavior normal.     ED Results / Procedures / Treatments   Labs (all labs ordered are listed, but only abnormal results are displayed) Labs Reviewed  BASIC METABOLIC PANEL - Abnormal; Notable for the following components:      Result Value   Glucose, Bld 100 (*)    All other components within normal limits  CBC WITH DIFFERENTIAL/PLATELET - Abnormal; Notable for the following components:   Hemoglobin 11.9 (*)    All other components within normal limits  URINALYSIS, ROUTINE W REFLEX MICROSCOPIC - Abnormal; Notable  for the following components:   APPearance HAZY (*)    Leukocytes,Ua MODERATE (*)    All other components within normal limits  URINALYSIS, MICROSCOPIC (REFLEX) - Abnormal; Notable for the following components:   Bacteria, UA MANY (*)    All other components within normal limits    EKG None  Radiology CT Head Wo Contrast  Result Date: 08/12/2022 CLINICAL DATA:  Headaches EXAM: CT HEAD WITHOUT CONTRAST TECHNIQUE: Contiguous axial images were obtained from the base of the skull through the vertex without intravenous contrast. RADIATION DOSE REDUCTION: This exam was performed according to the departmental dose-optimization program which includes automated exposure control, adjustment of the mA and/or kV according to patient size and/or use of iterative reconstruction technique. COMPARISON:  None Available. FINDINGS: Brain: No acute intracranial findings are seen. There are no signs of bleeding within the cranium. Ventricles are not dilated. There is no focal edema or mass effect. Vascular: Unremarkable. Skull: Unremarkable. Sinuses/Orbits: Unremarkable Other: None. IMPRESSION: No acute intracranial findings are seen in noncontrast CT brain. Electronically Signed   By: Ernie Avena M.D.   On: 08/12/2022 16:50    Procedures Procedures    Medications Ordered in ED Medications - No data to display  ED Course/ Medical Decision Making/ A&P                           Medical Decision Making Amount and/or Complexity of Data Reviewed Labs: ordered. Radiology: ordered. ECG/medicine tests: ordered.   Pt presented to the ED with {symptoms and how long.}. {Was patient out of medications? If so, which ones?}. {H/o headache?} {Any additional pertinent sx? CP, vision change, UA sx, photophobia, phonophobia?}. Initial blood pressure elevated at ***/*** on arrival. Vital signs ***. On exam, pt with ***. No focal neuro deficits. No vision changes.  No red flags of neck pain, neck stiffness, focal  neuro deficits, or worst headache of life. Differential diagnosis includes SAH, ICH, Hypertensive urgency, Hypertensive emergency, migraine, tension headache.   Additional history obtained:  Additional history obtained from {sabhistory:27144} External records from outside source obtained and reviewed including: ***  Labs:  I ordered, and personally interpreted labs.  The pertinent results include:   Troponin *** CBC *** CMP *** Urinalysis ***  Imaging: I ordered imaging studies including *** CTA Head/Neck and CXR {if chest concerns} obtained. I independently visualized and interpreted imaging which showed: *** I agree with the radiologist interpretation  Medications:  I ordered medication including *** for *** {migraine cocktail, and home HTN meds, if no home HTN meds, then treat with hydralazine, labetalol, nitro paste} Reevaluation of the patient after these  medicines and interventions, I reevaluated the patient and found that they have {resolved/improved/worsened:23923::"improved"} I have reviewed the patients home medicines and have made adjustments as needed   {Cardiac Monitoring: The patient was maintained on a cardiac monitor.  I personally viewed and interpreted the cardiac monitored which showed an underlying rhythm of: ***.   Test Considered: ***   Critical Interventions ***}   {Consultations: I requested consultation with the {sabspecialists:27145}, and discussed lab and imaging findings as well as pertinent plan - they recommend: ***}   Disposition: {End of MDM here with the likely diagnosis}. EKG without acute ST/T changes. Labs {trop, cmp, cbc, ua} without acute findings. Troponins within normal limits. ***  CTA Head/Neck showed no acute intracranial bleed. *** CXR without cardiomegaly or acute cardiac or respiratory findings. *** Patient headache improved *** with migraine cocktail and at home HTN medications. Presentation less likely due to [facility] or ICH. This is  likely headache in the setting of hypertensive urgency due to missed HTN medications ***. Repeat vital signs with blood pressure improved to ***/***. Patient resting comfortably on stretcher. After consideration of the diagnostic results and the patients response to treatment, I feel that the patient would benefit from {sabdispo:27146}. Will provide patient with 30-day prescription of {home meds} and primary care resources for follow up and further management of hypertension. *** Stressed importance of taking hypertension medications as prescribed and getting a follow up appointment with a primary care provider. Discussed supportive care measures and strict return precautions with patient consisting of increasing persistent chest pain, increasing persistent shortness of breath, sudden onset headache.  Pt acknowledges and verbalizes understanding and is agreeable to plan. Patient appears safe for discharge at this time.  Follow-up as indicated in the discharge paperwork.  This chart was dictated using voice recognition software, Dragon. Despite the best efforts of this provider to proofread and correct errors, errors may still occur which can change documentation meaning.  Final Clinical Impression(s) / ED Diagnoses Final diagnoses:  None    Rx / DC Orders ED Discharge Orders     None

## 2022-08-12 NOTE — ED Notes (Signed)
Pt reported burning in arm above right IV site after adm of toradol. IV discontinued and restarted on right side without difficulty

## 2022-08-12 NOTE — ED Triage Notes (Signed)
Hypertension x 1 day , Hx of it , called her PCP  to change meds , no answer.  Headache and ear ringing .  No vision changes , alert and oriented x 4

## 2022-08-12 NOTE — ED Provider Triage Note (Signed)
Emergency Medicine Provider Triage Evaluation Note  Cheyenna Pankowski , a 71 y.o. female  was evaluated in triage.  Pt complains of 1 day. Notes that her blood pressure was initially 200 systolically PTA. Had her medications changed recently. Has associated right sided headaches. No meds tried PTA. No chest pain, shortness of breath, abdominal pain, nausea, vomiting, urinary symptoms, blurred vision, double vision.   Review of Systems  Positive:  Negative:   Physical Exam  BP (!) 187/96 (BP Location: Right Arm)   Pulse 75   Temp (!) 96.6 F (35.9 C)   Resp 17   Ht 5\' 3"  (1.6 m)   Wt 72.6 kg   SpO2 97%   BMI 28.34 kg/m  Gen:   Awake, no distress  Resp:  Normal effort  MSK:   Moves extremities without difficulty  Other:  No focal neuro deficits. Negative pronator drift. Able to ambulate without assistance or difficulty.   Medical Decision Making  Medically screening exam initiated at 4:34 PM.  Appropriate orders placed.  Jalayiah Bibian was informed that the remainder of the evaluation will be completed by another provider, this initial triage assessment does not replace that evaluation, and the importance of remaining in the ED until their evaluation is complete.  Work-up initiated.    Liba Hulsey A, PA-C 08/12/22 1639

## 2022-08-14 ENCOUNTER — Other Ambulatory Visit: Payer: Self-pay

## 2022-08-14 ENCOUNTER — Emergency Department (HOSPITAL_BASED_OUTPATIENT_CLINIC_OR_DEPARTMENT_OTHER)
Admission: EM | Admit: 2022-08-14 | Discharge: 2022-08-15 | Disposition: A | Discharge status: Home / Self Care | Payer: Medicare HMO | Attending: Emergency Medicine | Admitting: Emergency Medicine

## 2022-08-14 ENCOUNTER — Encounter (HOSPITAL_BASED_OUTPATIENT_CLINIC_OR_DEPARTMENT_OTHER): Payer: Self-pay | Admitting: Emergency Medicine

## 2022-08-14 DIAGNOSIS — E119 Type 2 diabetes mellitus without complications: Secondary | ICD-10-CM | POA: Insufficient documentation

## 2022-08-14 DIAGNOSIS — N390 Urinary tract infection, site not specified: Secondary | ICD-10-CM | POA: Insufficient documentation

## 2022-08-14 DIAGNOSIS — I10 Essential (primary) hypertension: Secondary | ICD-10-CM | POA: Insufficient documentation

## 2022-08-14 DIAGNOSIS — E1165 Type 2 diabetes mellitus with hyperglycemia: Secondary | ICD-10-CM | POA: Diagnosis not present

## 2022-08-14 DIAGNOSIS — Z79899 Other long term (current) drug therapy: Secondary | ICD-10-CM | POA: Insufficient documentation

## 2022-08-14 NOTE — ED Triage Notes (Signed)
Pt returns for continued elevated BP; sts she is taking medication as directed at discharge; c/o HA

## 2022-08-15 ENCOUNTER — Ambulatory Visit: Payer: Medicare HMO | Attending: Cardiology | Admitting: Cardiology

## 2022-08-15 ENCOUNTER — Encounter: Payer: Self-pay | Admitting: Cardiology

## 2022-08-15 VITALS — BP 182/88 | HR 76 | Ht 64.0 in | Wt 164.0 lb

## 2022-08-15 DIAGNOSIS — E7800 Pure hypercholesterolemia, unspecified: Secondary | ICD-10-CM

## 2022-08-15 DIAGNOSIS — Z09 Encounter for follow-up examination after completed treatment for conditions other than malignant neoplasm: Secondary | ICD-10-CM | POA: Diagnosis not present

## 2022-08-15 DIAGNOSIS — I10 Essential (primary) hypertension: Secondary | ICD-10-CM

## 2022-08-15 DIAGNOSIS — Z6830 Body mass index (BMI) 30.0-30.9, adult: Secondary | ICD-10-CM | POA: Diagnosis not present

## 2022-08-15 HISTORY — DX: Essential (primary) hypertension: I10

## 2022-08-15 MED ORDER — CHLORTHALIDONE 25 MG PO TABS: 12.5000 mg | ORAL_TABLET | Freq: Every day | ORAL | 3 refills | Status: AC

## 2022-08-15 MED ORDER — FOSFOMYCIN TROMETHAMINE 3 G PO PACK
3.0000 g | PACK | Freq: Once | ORAL | Status: AC
  Administered 2022-08-15: 3 g via ORAL
  Filled 2022-08-15: qty 3

## 2022-08-15 MED ORDER — AMLODIPINE BESYLATE 10 MG PO TABS: 10.0000 mg | ORAL_TABLET | Freq: Every day | ORAL | 0 refills | Status: AC

## 2022-08-15 MED ORDER — AMLODIPINE BESYLATE 5 MG PO TABS
10.0000 mg | ORAL_TABLET | Freq: Once | ORAL | Status: AC
  Administered 2022-08-15: 10 mg via ORAL
  Filled 2022-08-15: qty 2

## 2022-08-15 NOTE — Progress Notes (Signed)
Cardiology Office Note:    Date:  08/15/2022   [name], Heather Woods, MRN [id number]  PCP:  Lucianne Lei, MD  Cardiologist:  Garwin Brothers, MD   Referring MD: Lucianne Lei, MD    ASSESSMENT:    1. Essential hypertension   2. Uncontrolled hypertension   3. Hypercholesteremia    PLAN:    In order of problems listed above:  Primary prevention stressed with patient.  Importance of compliance with diet and medication stressed and she vocalized understanding.  She was advised to go to the emergency room if things get worse again. Uncontrolled hypertension: Rest and relaxation was advised.  Lifestyle modification urged.  Salt intake issues were discussed.  I prescribed chlorthalidone 25 mg half tablet to be taken tonight and half tablet every morning beginning tomorrow morning.  She vocalized understanding.  Questions were answered to her satisfaction.  She was advised to take some extra potassium in the diet.  She will be back in a week for a Chem-7.  Wednesday morning she will give [place] a call and let her know about her blood pressure recordings in the past. Mixed dyslipidemia: Diet was emphasized.  She promises to do better. Patient will be seen in follow-up appointment in 6 weeks or earlier if the patient has any concerns.  I reviewed the emergency room records.    Medication Adjustments/Labs and Tests Ordered: Current medicines are reviewed at length with the patient today.  Concerns regarding medicines are outlined above.  Orders Placed This Encounter  Procedures   Basic metabolic panel   Meds ordered this encounter  Medications   chlorthalidone (HYGROTON) 25 MG tablet    Sig: Take 0.5 tablets (12.5 mg total) by mouth daily.    Dispense:  90 tablet    Refill:  3     No chief complaint on file.    History of Present Illness:    Heather Woods is a 71 y.o. female.  Patient has past medical history of essential hypertension and dyslipidemia.  She mentions  to me that she has had a headache and elevated blood pressure with the past couple of weeks.  She is emergency room's for this.  Her head ache virtually has persisted and a CT scan of the head did not reveal any significant pathology and I reviewed that CT scan report.  At the time of my evaluation, the patient is alert awake oriented and in no distress.  Past Medical History:  Diagnosis Date   Adult BMI 29.0-29.9 kg/sq m    Allergic bronchitis    Allergic rhinitis 09/08/2017   Allergic rhinosinusitis    Anxiety    Anxiety and depression    At risk for falls    Bronchitis    Depression    Diarrhea    Diet-controlled diabetes mellitus (HCC) 12/11/2020   DM (diabetes mellitus), type 2 (HCC)    Dyslipidemia 09/08/2017   Dyspnea on exertion 09/08/2017   Dysuria    Essential hypertension 09/08/2017   Eustachian catarrh, bilateral    Excess ear wax    Excessive ear wax, bilateral    Grief    High cholesterol    History of chest pain    Hypercholesteremia    Hyperglycemia    Hyperlipidemia    Hypertension    Irregular bowel habits    Menopause    Mood disorder (HCC)    Obesity    Other specified disorders of bone density and structure, other site  Overactive bladder    Overweight    Palpitations 09/08/2017   Panic attack    Shoulder arthritis    Skin lesion of face    Tendinitis, de Quervain's    Urinary tract infection    Vitamin D deficiency    Vitamin D deficiency     Past Surgical History:  Procedure Laterality Date   BREAST BIOPSY  2006   Pathology normal   TONSILLECTOMY  1999   TUBAL LIGATION  1983    Current Medications: Current Meds  Medication Sig   amLODipine (NORVASC) 10 MG tablet Take 1 tablet (10 mg total) by mouth daily.   chlorthalidone (HYGROTON) 25 MG tablet Take 0.5 tablets (12.5 mg total) by mouth daily.   fluticasone (FLONASE) 50 MCG/ACT nasal spray Place 2 sprays into both nostrils as needed for rhinitis.   losartan (COZAAR) 100 MG tablet  Take 1 tablet (100 mg total) by mouth daily.   rosuvastatin (CRESTOR) 20 MG tablet Take 20 mg by mouth daily.   sertraline (ZOLOFT) 100 MG tablet Take 100 mg by mouth daily.    Vitamin D, Ergocalciferol, (DRISDOL) 1.25 MG (50000 UNIT) CAPS capsule Take 50,000 Units by mouth once a week.     Allergies:   Lisinopril and Penicillins   Social History   Socioeconomic History   Marital status: Divorced    Spouse name: Not on file   Number of children: Not on file   Years of education: Not on file   Highest education level: Not on file  Occupational History   Not on file  Tobacco Use   Smoking status: Never   Smokeless tobacco: Never  Vaping Use   Vaping Use: Never used  Substance and Sexual Activity   Alcohol use: No   Drug use: No   Sexual activity: Yes    Birth control/protection: Post-menopausal  Other Topics Concern   Not on file  Social History Narrative   Not on file   Social Determinants of Health   Financial Resource Strain: Not on file  Food Insecurity: Not on file  Transportation Needs: Not on file  Physical Activity: Not on file  Stress: Not on file  Social Connections: Not on file     Family History: The patient's family history includes Diabetes in her brother and mother; Hypertension in her brother, father, and sister.  ROS:   Please see the history of present illness.    All other systems reviewed and are negative.  EKGs/Labs/Other Studies Reviewed:    The following studies were reviewed today: EKG reveals sinus and nonspecific ST-T changes   Recent Labs: 09/23/2021: ALT 22; Magnesium 2.3 08/12/2022: BUN 15; Creatinine, Ser 0.94; Hemoglobin 11.9; Platelets 272; Potassium 4.2; Sodium 141  Recent Lipid Panel    Component Value Date/Time   CHOL 191 12/04/2020 1649   TRIG 99 12/04/2020 1649   HDL 47 12/04/2020 1649   CHOLHDL 4.1 12/04/2020 1649   LDLCALC 126 (H) 12/04/2020 1649    Physical Exam:    VS:  BP (!) 182/88 (BP Location: Left Arm,  Patient Position: Sitting, Cuff Size: Normal)   Pulse 76   Ht 5\' 4"  (1.626 m)   Wt 164 lb (74.4 kg)   SpO2 96%   BMI 28.15 kg/m     Wt Readings from Last 3 Encounters:  08/15/22 164 lb (74.4 kg)  08/14/22 166 lb (75.3 kg)  08/12/22 160 lb (72.6 kg)     GEN: Patient is in no acute distress HEENT: Normal  NECK: No JVD; No carotid bruits LYMPHATICS: No lymphadenopathy CARDIAC: Hear sounds regular, 2/6 systolic murmur at the apex. RESPIRATORY:  Clear to auscultation without rales, wheezing or rhonchi  ABDOMEN: Soft, non-tender, non-distended MUSCULOSKELETAL:  No edema; No deformity  SKIN: Warm and dry NEUROLOGIC:  Alert and oriented x 3 PSYCHIATRIC:  Normal affect   Signed, Garwin Brothers, MD  08/15/2022 5:03 PM    [facility]

## 2022-08-15 NOTE — Patient Instructions (Signed)
Medication Instructions:  Your physician has recommended you make the following change in your medication:   Start Chlorthalidone 25 mg take 12.5 mg (1/2) tablet daily. Keep a BP log twice daily and call on Wednesday morning with your readings.  *If you need a refill on your cardiac medications before your next appointment, please call your pharmacy*   Lab Work: Your physician recommends that you have a BMET done in 1 week. You do not need to fast or to have an appointment.  [facility] [address] in [place]. They also close daily for lunch for 12-1.  or [facility] [address] 2nd floor M-W 8-11:30 and 1-4:30 and Thursday and Friday 8-11:30.  If you have labs (blood work) drawn today and your tests are completely normal, you will receive your results only by: MyChart Message (if you have MyChart) OR A paper copy in the mail If you have any lab test that is abnormal or we need to change your treatment, we will call you to review the results.   Testing/Procedures: None ordered   Follow-Up: At [facility], you and your health needs are our priority.  As part of our continuing mission to provide you with exceptional heart care, we have created designated Provider Care Teams.  These Care Teams include your primary Cardiologist (physician) and Advanced Practice Providers (APPs -  Physician Assistants and Nurse Practitioners) who all work together to provide you with the care you need, when you need it.  We recommend signing up for the patient portal called "MyChart".  Sign up information is provided on this After Visit Summary.  MyChart is used to connect with patients for Virtual Visits (Telemedicine).  Patients are able to view lab/test results, encounter notes, upcoming appointments, etc.  Non-urgent messages can be sent to your provider as well.   To learn more about what you can do with MyChart, go to [email or web address].    Your next appointment:   2  month(s)  The format for your next appointment:   In Person  Provider:   Belva Crome, MD   Other Instructions NA

## 2022-08-15 NOTE — ED Provider Notes (Signed)
[place] DEPT [facility] Provider Note: Georgena Spurling, MD, FACEP  CSN: [id number] MRN: [id number] ARRIVAL: 08/14/22 at 2149 ROOM: [place]  Hypertension   HISTORY OF PRESENT ILLNESS  08/15/22 12:45 AM Heather Woods is a 71 y.o. female who was seen in the ED on 07/12/2022 out of concern for poorly controlled hypertension.  She reports a systolic as high as [id number] prior to arrival on that visit.  She reportedly had had her medications recently changed.  She was having associated right-sided headaches.  At that time she had no chest pain, shortness of breath, abdominal pain, nausea, vomiting or visual changes.  Reportedly she had been on Hyzaar (losartan 100 mg/HCTZ 25 mg) which was replaced with losartan 50 mg.  On that ED visit she was switched to 100 mg losartan.   She continues to have shooting pains in her head in various locations but otherwise denies any other symptoms.  Her previous work-up included a negative head CT and unremarkable laboratory studies except her urinalysis was consistent with a urinary tract infection.   Past Medical History:  Diagnosis Date   Adult BMI 29.0-29.9 kg/sq m    Allergic bronchitis    Allergic rhinitis 09/08/2017   Allergic rhinosinusitis    Anxiety    Anxiety and depression    At risk for falls    Bronchitis    Depression    Diarrhea    Diet-controlled diabetes mellitus ([place]) 12/11/2020   DM (diabetes mellitus), type 2 ([place])    Dyslipidemia 09/08/2017   Dyspnea on exertion 09/08/2017   Dysuria    Essential hypertension 09/08/2017   Eustachian catarrh, bilateral    Excess ear wax    Excessive ear wax, bilateral    Grief    High cholesterol    History of chest pain    Hypercholesteremia    Hyperglycemia    Hyperlipidemia    Hypertension    Irregular bowel habits    Menopause    Mood disorder ([place])    Obesity    Other specified disorders of bone density and structure, other site    Overactive bladder     Overweight    Palpitations 09/08/2017   Panic attack    Shoulder arthritis    Skin lesion of face    Tendinitis, de Quervain's    Urinary tract infection    Vitamin D deficiency    Vitamin D deficiency     Past Surgical History:  Procedure Laterality Date   BREAST BIOPSY  2006   Pathology normal   TONSILLECTOMY  1999   TUBAL LIGATION  1983    Family History  Problem Relation Age of Onset   Diabetes Mother    Hypertension Father    Hypertension Sister    Hypertension Brother    Diabetes Brother     Social History   Tobacco Use   Smoking status: Never   Smokeless tobacco: Never  Vaping Use   Vaping Use: Never used  Substance Use Topics   Alcohol use: No   Drug use: No    Prior to Admission medications   Medication Sig Start Date End Date Taking? Authorizing Provider  atorvastatin (LIPITOR) 80 MG tablet Take 1 tablet (80 mg total) by mouth daily. 01/03/22   Revankar, Reita Cliche, MD  doxycycline (VIBRAMYCIN) 100 MG capsule Take 1 capsule (100 mg total) by mouth 2 (two) times daily. One po bid x 7 days 02/19/22   Deno Etienne, DO  fluticasone [facility])  50 MCG/ACT nasal spray Place 2 sprays into both nostrils as needed for rhinitis. 10/05/20   [provider]  losartan (COZAAR) 100 MG tablet Take 1 tablet (100 mg total) by mouth daily. 08/12/22   Blue, Soijett A, PA-C  losartan-hydrochlorothiazide (HYZAAR) 100-25 MG tablet Take 1 tablet by mouth daily. 07/07/22   [provider]  potassium chloride SA (KLOR-CON M) 20 MEQ tablet Take 1 tablet (20 mEq total) by mouth 2 (two) times daily for 5 days. 09/23/21 09/28/21  Haskel Schroeder, PA-C  rosuvastatin (CRESTOR) 20 MG tablet Take 20 mg by mouth daily. 07/05/22   [provider]  sertraline (ZOLOFT) 100 MG tablet Take 100 mg by mouth daily.     [provider]  Vitamin D, Ergocalciferol, (DRISDOL) 1.25 MG (50000 UNIT) CAPS capsule Take 50,000 Units by mouth once a week. 07/05/22   [provider]    Allergies Lisinopril and Penicillins   REVIEW OF SYSTEMS  Negative except as noted here or in the History of Present Illness.   PHYSICAL EXAMINATION  Initial Vital Signs Blood pressure (!) 188/98, pulse 70, temperature 98 F (36.7 C), resp. rate 18, height 5\' 3"  (1.6 m), weight 75.3 kg, SpO2 98 %.  Examination General: Well-developed, well-nourished female in no acute distress; appearance consistent with age of record HENT: normocephalic; atraumatic Eyes: pupils equal, round and reactive to light; extraocular muscles intact Neck: supple Heart: regular rate and rhythm Lungs: clear to auscultation bilaterally Abdomen: soft; nondistended; nontender; bowel sounds present Extremities: No deformity; full range of motion Neurologic: Awake, alert and oriented; motor function intact in all extremities and symmetric; no facial droop Skin: Warm and dry Psychiatric: Normal mood and affect   RESULTS  Summary of this visit's results, reviewed and interpreted by myself:   EKG Interpretation  Date/Time:    Ventricular Rate:    PR Interval:    QRS Duration:   QT Interval:    QTC Calculation:   R Axis:     Text Interpretation:         Laboratory Studies: No results found for this or any previous visit (from the past 24 hour(s)). Imaging Studies: No results found.  ED COURSE and MDM  Nursing notes, initial and subsequent vitals signs, including pulse oximetry, reviewed and interpreted by myself.  Vitals:   08/14/22 2226 08/14/22 2229  BP: (!) 188/98   Pulse: 70   Resp: 18   Temp: 98 F (36.7 C)   SpO2: 98%   Weight:  75.3 kg  Height:  5\' 3"  (1.6 m)   Medications  amLODipine (NORVASC) tablet 10 mg (has no administration in time range)  fosfomycin (MONUROL) packet 3 g (has no administration in time range)    12:57 AM The patient is having no new symptoms which would warrant additional work-up.  She had a thorough work-up 3 days ago including a head  CT.  I am concerned that she may have a nascent urinary tract infection that was not addressed and we will treat that here.  I think it is reasonable to start her on Norvasc in addition to the losartan and she was advised to follow-up with her PCP as the emergency department is not ideal for long-term management of hypertension.  PROCEDURES  Procedures   ED DIAGNOSES     ICD-10-CM   1. Hypertension not at goal  I10     2. Lower urinary tract infection  N39.0  Annastacia Duba, Jenny Reichmann, MD 08/15/22 [id number]

## 2022-09-08 DIAGNOSIS — F39 Unspecified mood [affective] disorder: Secondary | ICD-10-CM | POA: Diagnosis not present

## 2022-09-08 DIAGNOSIS — F410 Panic disorder [episodic paroxysmal anxiety] without agoraphobia: Secondary | ICD-10-CM | POA: Diagnosis not present

## 2022-09-08 DIAGNOSIS — F411 Generalized anxiety disorder: Secondary | ICD-10-CM | POA: Diagnosis not present

## 2022-09-09 ENCOUNTER — Encounter (HOSPITAL_BASED_OUTPATIENT_CLINIC_OR_DEPARTMENT_OTHER): Payer: Self-pay

## 2022-09-09 DIAGNOSIS — R0683 Snoring: Secondary | ICD-10-CM

## 2022-10-11 ENCOUNTER — Ambulatory Visit (HOSPITAL_BASED_OUTPATIENT_CLINIC_OR_DEPARTMENT_OTHER): Payer: Medicare Other | Attending: Internal Medicine | Admitting: Internal Medicine

## 2022-10-11 VITALS — Ht 64.0 in | Wt 167.0 lb

## 2022-10-11 DIAGNOSIS — R0683 Snoring: Secondary | ICD-10-CM | POA: Diagnosis present

## 2022-10-11 DIAGNOSIS — G4733 Obstructive sleep apnea (adult) (pediatric): Secondary | ICD-10-CM | POA: Insufficient documentation

## 2022-10-11 DIAGNOSIS — G4736 Sleep related hypoventilation in conditions classified elsewhere: Secondary | ICD-10-CM | POA: Insufficient documentation

## 2022-10-12 ENCOUNTER — Other Ambulatory Visit: Payer: Self-pay

## 2022-10-18 ENCOUNTER — Encounter: Payer: Self-pay | Admitting: Cardiology

## 2022-10-18 ENCOUNTER — Ambulatory Visit: Payer: Medicare Other | Attending: Cardiology | Admitting: Cardiology

## 2022-10-18 VITALS — BP 112/70 | HR 76 | Ht 64.0 in | Wt 168.1 lb

## 2022-10-18 DIAGNOSIS — I10 Essential (primary) hypertension: Secondary | ICD-10-CM | POA: Diagnosis not present

## 2022-10-18 DIAGNOSIS — E663 Overweight: Secondary | ICD-10-CM | POA: Diagnosis not present

## 2022-10-18 DIAGNOSIS — E785 Hyperlipidemia, unspecified: Secondary | ICD-10-CM | POA: Diagnosis not present

## 2022-10-18 DIAGNOSIS — E1100 Type 2 diabetes mellitus with hyperosmolarity without nonketotic hyperglycemic-hyperosmolar coma (NKHHC): Secondary | ICD-10-CM

## 2022-10-18 DIAGNOSIS — I700 Atherosclerosis of aorta: Secondary | ICD-10-CM

## 2022-10-18 NOTE — Patient Instructions (Signed)

## 2022-10-18 NOTE — Progress Notes (Signed)
Cardiology Office Note:    Date:  10/18/2022   [name], Heather Woods Feb 27, 1951, MRN [id number]  PCP:  Nicholos Johns, MD  Cardiologist:  Jenean Lindau, MD   Referring MD: Nicholos Johns, MD    ASSESSMENT:    1. Essential hypertension   2. Type 2 diabetes mellitus with hyperosmolarity without coma, without long-term current use of insulin ([place])   3. Dyslipidemia   4. Overweight   5. Aortic atherosclerosis (HCC)    PLAN:    In order of problems listed above:  Primary prevention stressed with the patient.  Importance of compliance with diet medication stressed and she vocalized understanding.  She was advised to walk at least half an hour a day 5 days a week and she promises to do so. Essential hypertension: Blood pressure is stable and diet was emphasized. Diabetes mellitus: Managed by primary care doctor.  Diet emphasized at length and questions were answered. Mixed dyslipidemia and aortic atherosclerosis: I discussed findings with the patient at extensive.  I told her that she needs to work harder to lose weight and also get her lipid numbers to less than 60 LDL.  She understands.  She promises to do better. Patient will be seen in follow-up appointment in 6 months or earlier if the patient has any concerns    Medication Adjustments/Labs and Tests Ordered: Current medicines are reviewed at length with the patient today.  Concerns regarding medicines are outlined above.  No orders of the defined types were placed in this encounter.  No orders of the defined types were placed in this encounter.    No chief complaint on file.    History of Present Illness:    Heather Woods is a 72 y.o. female.  Patient has past medical history of aortic atherosclerosis, essential hypertension, mixed dyslipidemia and diabetes mellitus.  Overall she leads a sedentary lifestyle and is overweight.  She denies any chest pain orthopnea or PND.  She is happy about her blood pressure.  At the  time of my evaluation, the patient is alert awake oriented and in no distress.  Past Medical History:  Diagnosis Date   Adult BMI 29.0-29.9 kg/sq m    Allergic bronchitis    Allergic rhinitis 09/08/2017   Allergic rhinosinusitis    Anxiety    Anxiety and depression    At risk for falls    Bronchitis    Depression    Diarrhea    Diet-controlled diabetes mellitus ([place]) 12/11/2020   DM (diabetes mellitus), type 2 ([place])    Dyslipidemia 09/08/2017   Dyspnea on exertion 09/08/2017   Dysuria    Essential hypertension 09/08/2017   Eustachian catarrh, bilateral    Excess ear wax    Excessive ear wax, bilateral    Grief    High cholesterol    History of chest pain    Hypercholesteremia    Hyperglycemia    Hyperlipidemia    Hypertension    Irregular bowel habits    Menopause    Mixed dyslipidemia 09/08/2017   Mood disorder ([place])    Obesity    Other specified disorders of bone density and structure, other site    Overactive bladder    Overweight    Palpitations 09/08/2017   Panic attack    Shoulder arthritis    Skin lesion of face    Tendinitis, de Quervain's    Uncontrolled hypertension 08/15/2022   Urinary tract infection    Vitamin D deficiency    Vitamin  D deficiency     Past Surgical History:  Procedure Laterality Date   BREAST BIOPSY  2006   Pathology normal   TONSILLECTOMY  1999   TUBAL LIGATION  1983    Current Medications: Current Meds  Medication Sig   amLODipine (NORVASC) 10 MG tablet Take 1 tablet (10 mg total) by mouth daily.   chlorthalidone (HYGROTON) 25 MG tablet Take 0.5 tablets (12.5 mg total) by mouth daily.   fluticasone (FLONASE) 50 MCG/ACT nasal spray Place 2 sprays into both nostrils as needed for rhinitis.   losartan (COZAAR) 100 MG tablet Take 1 tablet (100 mg total) by mouth daily.   montelukast (SINGULAIR) 10 MG tablet Take 10 mg by mouth at bedtime.   oxybutynin (DITROPAN) 5 MG tablet Take 5 mg by mouth 2 (two) times daily.    rosuvastatin (CRESTOR) 20 MG tablet Take 20 mg by mouth daily.   sertraline (ZOLOFT) 100 MG tablet Take 100 mg by mouth daily.    Vitamin D, Ergocalciferol, (DRISDOL) 1.25 MG (50000 UNIT) CAPS capsule Take 50,000 Units by mouth once a week.     Allergies:   Lisinopril and Penicillins   Social History   Socioeconomic History   Marital status: Divorced    Spouse name: Not on file   Number of children: Not on file   Years of education: Not on file   Highest education level: Not on file  Occupational History   Not on file  Tobacco Use   Smoking status: Never   Smokeless tobacco: Never  Vaping Use   Vaping Use: Never used  Substance and Sexual Activity   Alcohol use: No   Drug use: No   Sexual activity: Yes    Birth control/protection: Post-menopausal  Other Topics Concern   Not on file  Social History Narrative   Not on file   Social Determinants of Health   Financial Resource Strain: Not on file  Food Insecurity: Not on file  Transportation Needs: Not on file  Physical Activity: Not on file  Stress: Not on file  Social Connections: Not on file     Family History: The patient's family history includes Diabetes in her brother and mother; Hypertension in her brother, father, and sister.  ROS:   Please see the history of present illness.    All other systems reviewed and are negative.  EKGs/Labs/Other Studies Reviewed:    The following studies were reviewed today: EKG reveals sinus rhythm and nonspecific ST-T changes   Recent Labs: 08/12/2022: BUN 15; Creatinine, Ser 0.94; Hemoglobin 11.9; Platelets 272; Potassium 4.2; Sodium 141  Recent Lipid Panel    Component Value Date/Time   CHOL 191 12/04/2020 1649   TRIG 99 12/04/2020 1649   HDL 47 12/04/2020 1649   CHOLHDL 4.1 12/04/2020 1649   LDLCALC 126 (H) 12/04/2020 1649    Physical Exam:    VS:  BP 112/70   Pulse 76   Ht 5\' 4"  (1.626 m)   Wt 168 lb 1.3 oz (76.2 kg)   SpO2 95%   BMI 28.85 kg/m     Wt  Readings from Last 3 Encounters:  10/18/22 168 lb 1.3 oz (76.2 kg)  10/11/22 167 lb (75.8 kg)  08/15/22 164 lb (74.4 kg)     GEN: Patient is in no acute distress HEENT: Normal NECK: No JVD; No carotid bruits LYMPHATICS: No lymphadenopathy CARDIAC: Hear sounds regular, 2/6 systolic murmur at the apex. RESPIRATORY:  Clear to auscultation without rales, wheezing or rhonchi  ABDOMEN: Soft, non-tender, non-distended MUSCULOSKELETAL:  No edema; No deformity  SKIN: Warm and dry NEUROLOGIC:  Alert and oriented x 3 PSYCHIATRIC:  Normal affect   Signed, Garwin Brothers, MD  10/18/2022 1:54 PM    [facility]

## 2022-10-22 DIAGNOSIS — R0683 Snoring: Secondary | ICD-10-CM

## 2022-10-22 DIAGNOSIS — G4733 Obstructive sleep apnea (adult) (pediatric): Secondary | ICD-10-CM

## 2022-10-22 DIAGNOSIS — G4736 Sleep related hypoventilation in conditions classified elsewhere: Secondary | ICD-10-CM

## 2022-10-22 NOTE — Procedures (Signed)
   Patient Name: Heather Woods, Heather Woods Date: 10/11/2022 Gender: Female D.O.B: 26-Oct-1950 Age (years): 22 Referring Provider: Nicholos Johns MD Height (inches): 34 Interpreting Physician: Baird Lyons MD, ABSM Weight (lbs): 167 RPSGT: Jacolyn Reedy BMI: 29 MRN: [id number] Neck Size: 14.00  CLINICAL INFORMATION Sleep Study Type: HST Indication for sleep study: OSA Epworth Sleepiness Score: N/A  SLEEP STUDY TECHNIQUE A multi-channel overnight portable sleep study was performed. The channels recorded were: nasal airflow, thoracic respiratory movement, and oxygen saturation with a pulse oximetry. Snoring was also monitored.  MEDICATIONS Patient self administered medications include: none reported.  SLEEP ARCHITECTURE Patient was studied for 364.4 minutes. The sleep efficiency was 100.0 % and the patient was supine for 0%. The arousal index was 0.0 per hour.  RESPIRATORY PARAMETERS The overall AHI was 14.0 per hour, with a central apnea index of 0 per hour. The oxygen nadir was 76% during sleep.  CARDIAC DATA Mean heart rate during sleep was 72.8 bpm.  IMPRESSIONS - Mild obstructive sleep apnea occurred during this study (AHI = 14.0/h). - Severe oxygen desaturation was noted during this study (Min O2 = 76%, Mean 92%). Time with O2 saturation 88% or less was 36.8 minutes. - Patient snored.  DIAGNOSIS - Obstructive Sleep Apnea (G47.33) - Nocturnal Hypoxemia (G47.36)  RECOMMENDATIONS - Suggest CPAP titration sleep study or autopap. Other options would be based on clinical judgment. - Be careful with alcohol, sedatives and other CNS depressants that may worsen sleep apnea and disrupt normal sleep architecture. - Sleep hygiene should be reviewed to assess factors that may improve sleep quality. - Weight management and regular exercise should be initiated or continued.  [Electronically signed] 10/22/2022 01:13 PM  Baird Lyons MD, [place], American Board of Sleep  Medicine NPI: [id number]                          [place], [place] [facility]  ELECTRONICALLY SIGNED ON:  10/22/2022, 12:44 PM [place] PH: [phone]   FX: [phone] [place]

## 2022-10-27 DIAGNOSIS — J069 Acute upper respiratory infection, unspecified: Secondary | ICD-10-CM | POA: Diagnosis not present

## 2022-12-14 ENCOUNTER — Emergency Department (HOSPITAL_BASED_OUTPATIENT_CLINIC_OR_DEPARTMENT_OTHER)
Admission: EM | Admit: 2022-12-14 | Discharge: 2022-12-14 | Disposition: A | Discharge status: Home / Self Care | Payer: Medicare Other | Attending: Emergency Medicine | Admitting: Emergency Medicine

## 2022-12-14 ENCOUNTER — Emergency Department (HOSPITAL_BASED_OUTPATIENT_CLINIC_OR_DEPARTMENT_OTHER): Payer: Medicare Other

## 2022-12-14 ENCOUNTER — Encounter (HOSPITAL_BASED_OUTPATIENT_CLINIC_OR_DEPARTMENT_OTHER): Payer: Self-pay | Admitting: Urology

## 2022-12-14 ENCOUNTER — Other Ambulatory Visit: Payer: Self-pay

## 2022-12-14 DIAGNOSIS — S0990XA Unspecified injury of head, initial encounter: Secondary | ICD-10-CM | POA: Diagnosis not present

## 2022-12-14 DIAGNOSIS — S199XXA Unspecified injury of neck, initial encounter: Secondary | ICD-10-CM | POA: Diagnosis not present

## 2022-12-14 DIAGNOSIS — M542 Cervicalgia: Secondary | ICD-10-CM | POA: Insufficient documentation

## 2022-12-14 DIAGNOSIS — W01198A Fall on same level from slipping, tripping and stumbling with subsequent striking against other object, initial encounter: Secondary | ICD-10-CM | POA: Diagnosis not present

## 2022-12-14 DIAGNOSIS — Z79899 Other long term (current) drug therapy: Secondary | ICD-10-CM | POA: Insufficient documentation

## 2022-12-14 NOTE — Discharge Instructions (Signed)
You were seen in the emergency department for evaluation of injury to your head and neck.  You had a CAT scan of your head and neck that did not show any traumatic findings.  You have some arthritis changes in your neck.  You can use Tylenol and ibuprofen as needed for pain and use ice to the affected area.  Follow-up with your regular doctor.  Return to the emergency department if any worsening or concerning symptoms

## 2022-12-14 NOTE — ED Triage Notes (Signed)
Pt states rails for gates fell on head this afternoon States frontal head pain  Small bump to right temple area  Denies any LOC at time of injury  No Blood thinners

## 2022-12-14 NOTE — ED Provider Notes (Signed)
[facility] Provider Note   CSN: [id number] Arrival date & time: 12/14/22  1857     History {Add pertinent medical, surgical, social history, OB history to HPI:1} Chief Complaint  Patient presents with   Head Injury    Heather Woods is a 72 y.o. female.  She is here with a complaint of head injury.  There was some aluminum fence posts [place] up on a rack that fell down and hit her in the side of the head.  No loss of consciousness not on blood thinners.  Complaining of pain to her right temple area and on the posterior lateral right neck.  No blurry vision double vision numbness or weakness.  No other injuries or complaints.  She has tried nothing for her symptoms.  The history is provided by the patient.  Head Injury Location:  R temporal Mechanism of injury: direct blow   Pain details:    Quality:  Throbbing   Severity:  Moderate   Timing:  Constant   Progression:  Unchanged Chronicity:  New Relieved by:  None tried Worsened by:  Nothing Ineffective treatments:  None tried Associated symptoms: neck pain   Associated symptoms: no blurred vision, no difficulty breathing, no double vision, no focal weakness, no loss of consciousness, no nausea and no vomiting        Home Medications Prior to Admission medications   Medication Sig Start Date End Date Taking? Authorizing Provider  amLODipine (NORVASC) 10 MG tablet Take 1 tablet (10 mg total) by mouth daily. 08/15/22   Molpus, John, MD  chlorthalidone (HYGROTON) 25 MG tablet Take 0.5 tablets (12.5 mg total) by mouth daily. 08/15/22 11/13/22  Revankar, Reita Cliche, MD  fluticasone (FLONASE) 50 MCG/ACT nasal spray Place 2 sprays into both nostrils as needed for rhinitis. 10/05/20   [provider]  losartan (COZAAR) 100 MG tablet Take 1 tablet (100 mg total) by mouth daily. 08/12/22   Blue, Soijett A, PA-C  montelukast (SINGULAIR) 10 MG tablet Take 10 mg by mouth at bedtime.  07/25/22   [provider]  oxybutynin (DITROPAN) 5 MG tablet Take 5 mg by mouth 2 (two) times daily. 08/15/22   [provider]  rosuvastatin (CRESTOR) 20 MG tablet Take 20 mg by mouth daily. 07/05/22   [provider]  sertraline (ZOLOFT) 100 MG tablet Take 100 mg by mouth daily.     [provider]  Vitamin D, Ergocalciferol, (DRISDOL) 1.25 MG (50000 UNIT) CAPS capsule Take 50,000 Units by mouth once a week. 07/05/22   [provider]      Allergies    Lisinopril and Penicillins    Review of Systems   Review of Systems  Eyes:  Negative for blurred vision, double vision and visual disturbance.  Respiratory:  Negative for shortness of breath.   Cardiovascular:  Negative for chest pain.  Gastrointestinal:  Negative for nausea and vomiting.  Musculoskeletal:  Positive for neck pain.  Skin:  Negative for wound.  Neurological:  Negative for focal weakness and loss of consciousness.    Physical Exam Updated Vital Signs BP 130/79 (BP Location: Left Arm)   Pulse 84   Temp 98.6 F (37 C)   Resp 18   Ht 5\' 4"  (1.626 m)   Wt 76.2 kg   SpO2 100%   BMI 28.84 kg/m  Physical Exam Vitals and nursing note reviewed.  Constitutional:      General: She is not in acute distress.  Appearance: Normal appearance. She is well-developed.  HENT:     Head: Normocephalic.     Comments: She has little bit of swelling and tenderness of her right temple area.  There is no overlying ecchymosis or open wounds. Eyes:     Conjunctiva/sclera: Conjunctivae normal.  Neck:     Comments: She has no midline cervical spine tenderness but is tender right paracervical. Cardiovascular:     Rate and Rhythm: Normal rate and regular rhythm.     Heart sounds: No murmur heard. Pulmonary:     Effort: Pulmonary effort is normal. No respiratory distress.     Breath sounds: Normal breath sounds. No stridor. No wheezing.  Abdominal:     Palpations: Abdomen is soft.      Tenderness: There is no abdominal tenderness. There is no guarding or rebound.  Musculoskeletal:        General: No deformity. Normal range of motion.     Cervical back: Neck supple. Tenderness present.  Skin:    General: Skin is warm and dry.  Neurological:     General: No focal deficit present.     Mental Status: She is alert.     GCS: GCS eye subscore is 4. GCS verbal subscore is 5. GCS motor subscore is 6.     Cranial Nerves: No cranial nerve deficit.     Sensory: No sensory deficit.     Motor: No weakness.     ED Results / Procedures / Treatments   Labs (all labs ordered are listed, but only abnormal results are displayed) Labs Reviewed - No data to display  EKG None  Radiology No results found.  Procedures Procedures  {Document cardiac monitor, telemetry assessment procedure when appropriate:1}  Medications Ordered in ED Medications - No data to display  ED Course/ Medical Decision Making/ A&P   {   Click here for ABCD2, HEART and other calculatorsREFRESH Note before signing :1}                          Medical Decision Making Amount and/or Complexity of Data Reviewed Radiology: ordered.   This patient complains of ***; this involves an extensive number of treatment Options and is a complaint that carries with it a high risk of complications and morbidity. The differential includes ***  I ordered, reviewed and interpreted labs, which included *** I ordered medication *** and reviewed PMP when indicated. I ordered imaging studies which included *** and I independently    visualized and interpreted imaging which showed *** Additional history obtained from *** Previous records obtained and reviewed *** I consulted *** and discussed lab and imaging findings and discussed disposition.  Cardiac monitoring reviewed, *** Social determinants considered, *** Critical Interventions: ***  After the interventions stated above, I reevaluated the patient and found  *** Admission and further testing considered, ***   {Document critical care time when appropriate:1} {Document review of labs and clinical decision tools ie heart score, Chads2Vasc2 etc:1}  {Document your independent review of radiology images, and any outside records:1} {Document your discussion with family members, caretakers, and with consultants:1} {Document social determinants of health affecting pt's care:1} {Document your decision making why or why not admission, treatments were needed:1} Final Clinical Impression(s) / ED Diagnoses Final diagnoses:  None    Rx / DC Orders ED Discharge Orders     None

## 2022-12-15 ENCOUNTER — Telehealth: Payer: Self-pay | Admitting: Internal Medicine

## 2022-12-15 DIAGNOSIS — I10 Essential (primary) hypertension: Secondary | ICD-10-CM | POA: Diagnosis not present

## 2022-12-15 DIAGNOSIS — R519 Headache, unspecified: Secondary | ICD-10-CM | POA: Diagnosis not present

## 2022-12-15 DIAGNOSIS — E785 Hyperlipidemia, unspecified: Secondary | ICD-10-CM | POA: Diagnosis not present

## 2022-12-15 DIAGNOSIS — Z79899 Other long term (current) drug therapy: Secondary | ICD-10-CM | POA: Diagnosis not present

## 2022-12-15 DIAGNOSIS — Z09 Encounter for follow-up examination after completed treatment for conditions other than malignant neoplasm: Secondary | ICD-10-CM | POA: Diagnosis not present

## 2022-12-15 DIAGNOSIS — M436 Torticollis: Secondary | ICD-10-CM | POA: Diagnosis not present

## 2022-12-15 DIAGNOSIS — E1169 Type 2 diabetes mellitus with other specified complication: Secondary | ICD-10-CM | POA: Diagnosis not present

## 2022-12-15 NOTE — Telephone Encounter (Signed)
Sure- happy to see her for management of sleep apnea

## 2022-12-15 NOTE — Telephone Encounter (Signed)
Patient has been under the assumption you would be sending in a cpap machine for her. I advised she would need to establish care with [place] in order to get a potentially get a machine since she has never been seen here Dr. Annamaria Boots please advise if your okay with me scheduling this patient

## 2022-12-15 NOTE — Telephone Encounter (Signed)
Pt states she has been getting calls from [facility] to get her a breathing machine. However, she is not a pt of our office. She had a sleep study on 10/11/22 which he resulted. Pt was advised that she was not established here and we likely wouldnt be able to do anything, but she would like a call back regardless.

## 2022-12-16 NOTE — Telephone Encounter (Signed)
Scheduled patient for new consult with Dr. Annamaria Boots. Advised he would need to see her in person before ordering cpap. She verbalized understanding. NFN

## 2022-12-26 IMAGING — DX DG CHEST 1V PORT
1 series · 1 of 1 positions shown · non-contrast
Comparison: January 07, 2017.

CLINICAL DATA: Heart racing.

EXAM:
PORTABLE CHEST 1 VIEW

[chest ap]
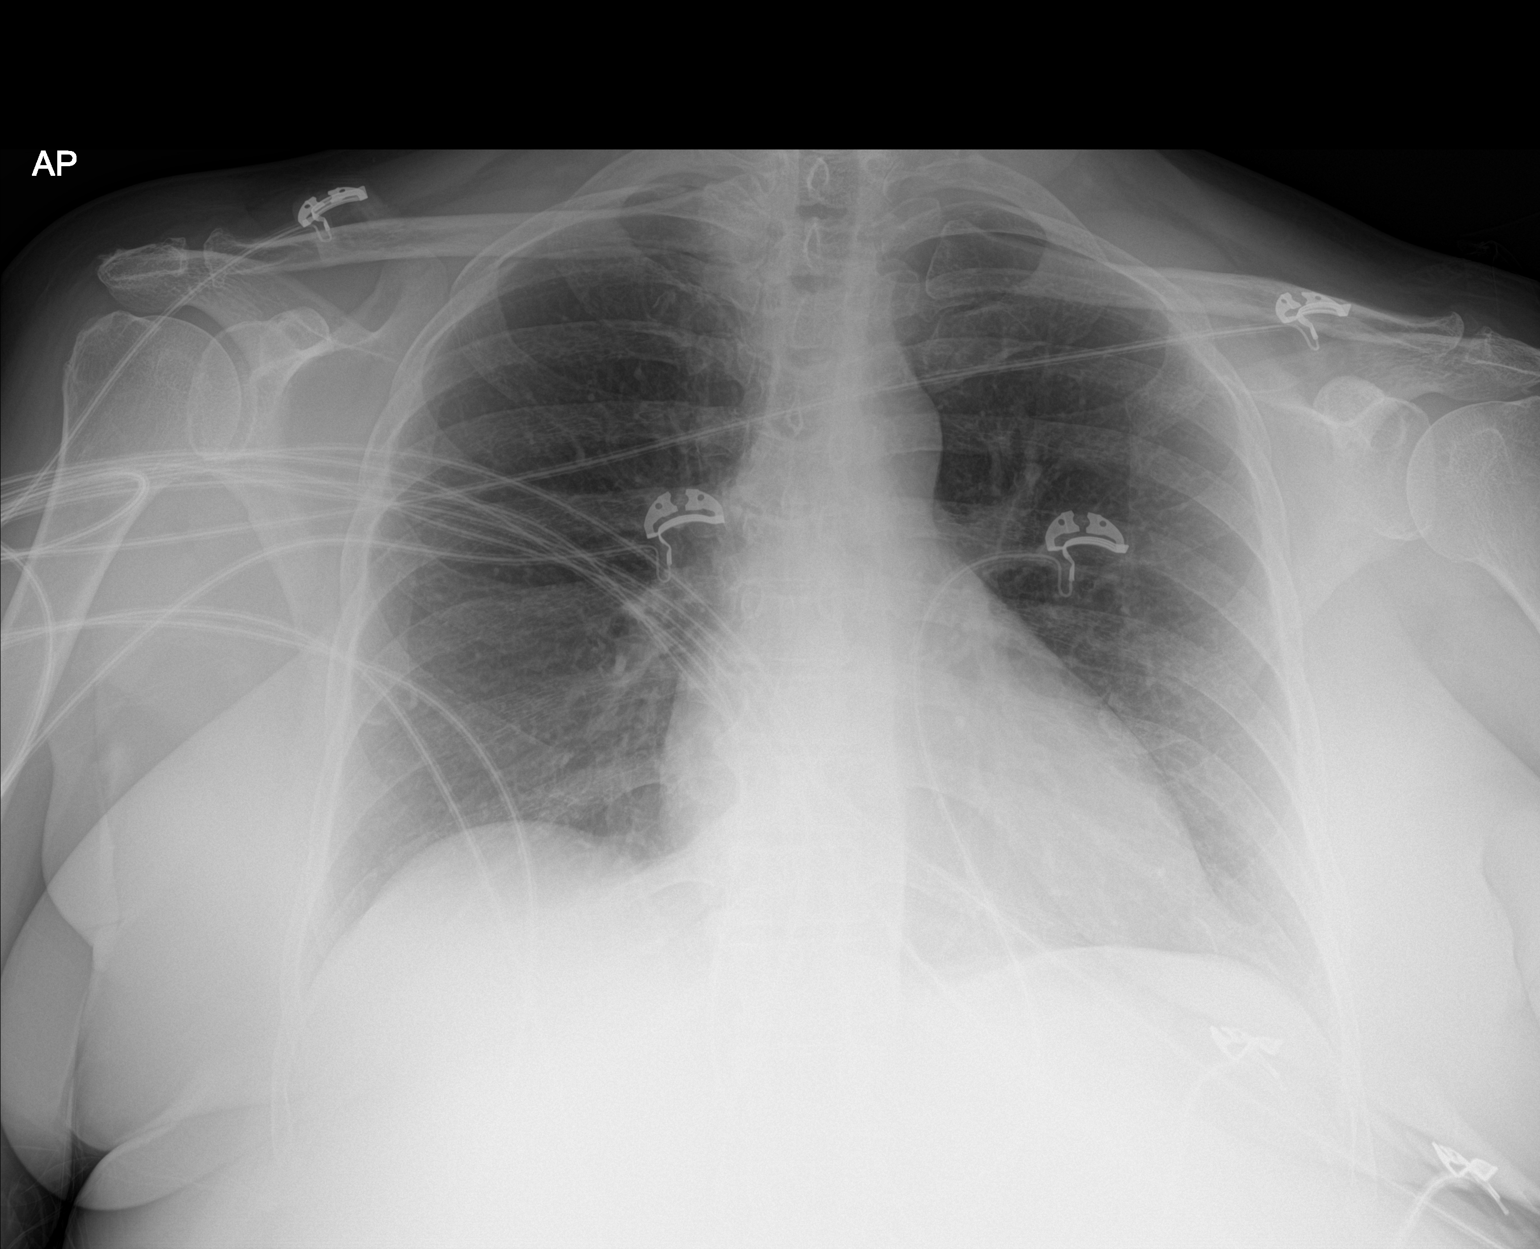

[1 of 1 positions shown; findings below may reference images not displayed]

FINDINGS: The heart size and mediastinal contours are within normal limits.
Both lungs are clear. The visualized skeletal structures are
unremarkable.
IMPRESSION: No active disease.

## 2022-12-27 DIAGNOSIS — E559 Vitamin D deficiency, unspecified: Secondary | ICD-10-CM | POA: Diagnosis not present

## 2023-01-02 ENCOUNTER — Encounter: Payer: Self-pay | Admitting: Primary Care

## 2023-01-02 ENCOUNTER — Ambulatory Visit (INDEPENDENT_AMBULATORY_CARE_PROVIDER_SITE_OTHER): Payer: Medicare Other | Admitting: Primary Care

## 2023-01-02 VITALS — BP 118/78 | HR 116 | Ht 63.0 in | Wt 163.0 lb

## 2023-01-02 DIAGNOSIS — G4730 Sleep apnea, unspecified: Secondary | ICD-10-CM

## 2023-01-02 DIAGNOSIS — R0683 Snoring: Secondary | ICD-10-CM

## 2023-01-02 NOTE — Progress Notes (Signed)
@Patient  ID: Heather Woods, female    DOB: 1951/07/27, 72 y.o.   MRN: [id number]  Chief Complaint  Patient presents with   Consult    Referring provider: Lucianne Lei, MD  HPI: 72 year old female, never smoked.  Past medical history significant for hypertension, aortic arthrosclerosis, allergic rhinitis, type 2 diabetes, overactive bladder, hyperlipidemia, mood disorder.  01/02/2023- Sleep consult  Patient presents today for sleep consult. She has symptoms of loud snoring. Associated daytime sleepiness. Typical bedtime is 11pm-12am. It takes her on average 30 mins to fall asleep. She starts her day at 8am. Epworth score 17. Patient had a home sleep study on 10/11/2022 that showed evidence of mild obstructive sleep apnea, AHI 14/hour with severe oxygen desaturation to 76% (average 92%).  Time with O2 saturation less than 88% was 36.8 minutes.  No symptoms of narcolepsy, cataplexy or sleepwalking.   Allergies  Allergen Reactions   Lisinopril Other (See Comments)    Lip tingling    Penicillins     Tachycardia      There is no immunization history on file for this patient.  Past Medical History:  Diagnosis Date   Adult BMI 29.0-29.9 kg/sq m    Allergic bronchitis    Allergic rhinitis 09/08/2017   Allergic rhinosinusitis    Anxiety    Anxiety and depression    At risk for falls    Bronchitis    Depression    Diarrhea    Diet-controlled diabetes mellitus 12/11/2020   DM (diabetes mellitus), type 2    Dyslipidemia 09/08/2017   Dyspnea on exertion 09/08/2017   Dysuria    Essential hypertension 09/08/2017   Eustachian catarrh, bilateral    Excess ear wax    Excessive ear wax, bilateral    Grief    High cholesterol    History of chest pain    Hypercholesteremia    Hyperglycemia    Hyperlipidemia    Hypertension    Irregular bowel habits    Menopause    Mixed dyslipidemia 09/08/2017   Mood disorder    Obesity    Other specified disorders of bone density and  structure, other site    Overactive bladder    Overweight    Palpitations 09/08/2017   Panic attack    Shoulder arthritis    Skin lesion of face    Tendinitis, de Quervain's    Uncontrolled hypertension 08/15/2022   Urinary tract infection    Vitamin D deficiency    Vitamin D deficiency     Tobacco History: Social History   Tobacco Use  Smoking Status Never  Smokeless Tobacco Never   Counseling given: Not Answered   Outpatient Medications Prior to Visit  Medication Sig Dispense Refill   amLODipine (NORVASC) 10 MG tablet Take 1 tablet (10 mg total) by mouth daily. 30 tablet 0   fluticasone (FLONASE) 50 MCG/ACT nasal spray Place 2 sprays into both nostrils as needed for rhinitis.     losartan (COZAAR) 100 MG tablet Take 1 tablet (100 mg total) by mouth daily. 30 tablet 0   montelukast (SINGULAIR) 10 MG tablet Take 10 mg by mouth at bedtime.     oxybutynin (DITROPAN) 5 MG tablet Take 5 mg by mouth 2 (two) times daily.     rosuvastatin (CRESTOR) 20 MG tablet Take 20 mg by mouth daily.     sertraline (ZOLOFT) 100 MG tablet Take 100 mg by mouth daily.      Vitamin D, Ergocalciferol, (DRISDOL) 1.25 MG (50000 UNIT) CAPS  capsule Take 50,000 Units by mouth once a week.     chlorthalidone (HYGROTON) 25 MG tablet Take 0.5 tablets (12.5 mg total) by mouth daily. 90 tablet 3   No facility-administered medications prior to visit.   Review of Systems  Review of Systems  Constitutional:  Positive for fatigue.  HENT: Negative.    Respiratory: Negative.    Cardiovascular: Negative.      Physical Exam  BP 118/78 (BP Location: Left Arm, Patient Position: Sitting, Cuff Size: Normal)   Pulse (!) 116   Ht 5\' 3"  (1.6 m)   Wt 163 lb (73.9 kg)   SpO2 96%   BMI 28.87 kg/m  Physical Exam Constitutional:      Appearance: Normal appearance.  HENT:     Head: Normocephalic and atraumatic.     Mouth/Throat:     Mouth: Mucous membranes are moist.     Pharynx: Oropharynx is clear.   Cardiovascular:     Rate and Rhythm: Normal rate and regular rhythm.  Pulmonary:     Effort: Pulmonary effort is normal.     Breath sounds: Normal breath sounds. No wheezing, rhonchi or rales.  Skin:    General: Skin is warm and dry.  Neurological:     General: No focal deficit present.     Mental Status: She is alert and oriented to person, place, and time. Mental status is at baseline.  Psychiatric:        Mood and Affect: Mood normal.        Behavior: Behavior normal.        Thought Content: Thought content normal.        Judgment: Judgment normal.      Lab Results:  CBC    Component Value Date/Time   WBC 7.6 08/12/2022 1640   RBC 4.30 08/12/2022 1640   HGB 11.9 (L) 08/12/2022 1640   HCT 37.2 08/12/2022 1640   PLT 272 08/12/2022 1640   MCV 86.5 08/12/2022 1640   MCH 27.7 08/12/2022 1640   MCHC 32.0 08/12/2022 1640   RDW 13.3 08/12/2022 1640   LYMPHSABS 2.0 08/12/2022 1640   MONOABS 0.5 08/12/2022 1640   EOSABS 0.1 08/12/2022 1640   BASOSABS 0.0 08/12/2022 1640    BMET    Component Value Date/Time   NA 141 08/12/2022 1640   NA 143 12/04/2020 1649   K 4.2 08/12/2022 1640   CL 107 08/12/2022 1640   CO2 28 08/12/2022 1640   GLUCOSE 100 (H) 08/12/2022 1640   BUN 15 08/12/2022 1640   BUN 12 12/04/2020 1649   CREATININE 0.94 08/12/2022 1640   CALCIUM 9.3 08/12/2022 1640   GFRNONAA >60 08/12/2022 1640   GFRAA >60 01/07/2017 0013    BNP No results found for: "BNP"  ProBNP No results found for: "PROBNP"  Imaging: CT Head Wo Contrast  Result Date: 12/14/2022 CLINICAL DATA:  Head trauma, minor (Age >= 65y); Neck trauma (Age >= 65y), fall EXAM: CT HEAD WITHOUT CONTRAST CT CERVICAL SPINE WITHOUT CONTRAST TECHNIQUE: Multidetector CT imaging of the head and cervical spine was performed following the standard protocol without intravenous contrast. Multiplanar CT image reconstructions of the cervical spine were also generated. RADIATION DOSE REDUCTION: This exam was  performed according to the departmental dose-optimization program which includes automated exposure control, adjustment of the mA and/or kV according to patient size and/or use of iterative reconstruction technique. COMPARISON:  None Available. FINDINGS: CT HEAD FINDINGS Brain: Normal anatomic configuration. No abnormal intra or extra-axial mass lesion or  fluid collection. No abnormal mass effect or midline shift. No evidence of acute intracranial hemorrhage or infarct. Ventricular size is normal. Cerebellum unremarkable. Vascular: Unremarkable Skull: Intact Sinuses/Orbits: Paranasal sinuses are clear. Orbits are unremarkable. Other: Mastoid air cells and middle ear cavities are clear. CT CERVICAL SPINE FINDINGS Alignment: Mild cervical kyphosis.  No listhesis. Skull base and vertebrae: Craniocervical alignment is normal. The atlantodental interval is not widened. No acute fracture of the cervical spine. Soft tissues and spinal canal: No prevertebral fluid or swelling. No visible canal hematoma. Disc levels: Disc space narrowing and endplate remodeling is seen throughout the cervical spine in keeping with changes of moderate to severe degenerative disc disease, most severe at C5-C7. Prevertebral soft tissues are not thickened on sagittal reformats. No high-grade canal stenosis. Multilevel uncovertebral and facet arthrosis results in multilevel moderate to severe neuroforaminal narrowing, most severe on the right at C3-4, the left at C4-5, bilaterally at C5-6 and bilaterally at C6-7, left greater than right. Upper chest: Negative. Other: None IMPRESSION: 1. No acute intracranial abnormality. No calvarial fracture. 2. No acute fracture or listhesis of the cervical spine. 3. Multilevel degenerative disc and degenerative joint disease resulting in multilevel neuroforaminal narrowing as described above. Electronically Signed   By: Helyn Numbers M.D.   On: 12/14/2022 20:16   CT Cervical Spine Wo Contrast  Result  Date: 12/14/2022 CLINICAL DATA:  Head trauma, minor (Age >= 65y); Neck trauma (Age >= 65y), fall EXAM: CT HEAD WITHOUT CONTRAST CT CERVICAL SPINE WITHOUT CONTRAST TECHNIQUE: Multidetector CT imaging of the head and cervical spine was performed following the standard protocol without intravenous contrast. Multiplanar CT image reconstructions of the cervical spine were also generated. RADIATION DOSE REDUCTION: This exam was performed according to the departmental dose-optimization program which includes automated exposure control, adjustment of the mA and/or kV according to patient size and/or use of iterative reconstruction technique. COMPARISON:  None Available. FINDINGS: CT HEAD FINDINGS Brain: Normal anatomic configuration. No abnormal intra or extra-axial mass lesion or fluid collection. No abnormal mass effect or midline shift. No evidence of acute intracranial hemorrhage or infarct. Ventricular size is normal. Cerebellum unremarkable. Vascular: Unremarkable Skull: Intact Sinuses/Orbits: Paranasal sinuses are clear. Orbits are unremarkable. Other: Mastoid air cells and middle ear cavities are clear. CT CERVICAL SPINE FINDINGS Alignment: Mild cervical kyphosis.  No listhesis. Skull base and vertebrae: Craniocervical alignment is normal. The atlantodental interval is not widened. No acute fracture of the cervical spine. Soft tissues and spinal canal: No prevertebral fluid or swelling. No visible canal hematoma. Disc levels: Disc space narrowing and endplate remodeling is seen throughout the cervical spine in keeping with changes of moderate to severe degenerative disc disease, most severe at C5-C7. Prevertebral soft tissues are not thickened on sagittal reformats. No high-grade canal stenosis. Multilevel uncovertebral and facet arthrosis results in multilevel moderate to severe neuroforaminal narrowing, most severe on the right at C3-4, the left at C4-5, bilaterally at C5-6 and bilaterally at C6-7, left greater  than right. Upper chest: Negative. Other: None IMPRESSION: 1. No acute intracranial abnormality. No calvarial fracture. 2. No acute fracture or listhesis of the cervical spine. 3. Multilevel degenerative disc and degenerative joint disease resulting in multilevel neuroforaminal narrowing as described above. Electronically Signed   By: Helyn Numbers M.D.   On: 12/14/2022 20:16     Assessment & Plan:   Mild sleep apnea - Patient has symptoms of snoring and associated daytime sleepiness.  She had a home sleep study on 10/11/2022 that showed evidence  of mild-moderate obstructive sleep apnea, average AHI was 14/hour with severe oxygen desaturation to 76% (average 92%).  We reviewed risks of untreated sleep apnea and treatment options including weight loss, oral appliance, CPAP therapy and referral to ENT for possible surgical options. Recommending patient be started on auto CPAP due to nocturnal hypoxemia.  And is in agreement with plan.  Will place DME order for patient to receive auto CPAP 15 cm H2O with mask of choice.  Encouraged weight loss efforts and side sleeping position.  Advised against driving if experiencing excessive daytime sleepiness or fatigue.  Follow-up 31 to 90 days after CPAP start for compliance check.  Overnight oximetry test once established on CPAP to ensure patient does not need nocturnal oxygen.     Glenford Bayley, NP 01/02/2023

## 2023-01-02 NOTE — Assessment & Plan Note (Addendum)
-   Patient has symptoms of snoring and associated daytime sleepiness.  She had a home sleep study on 10/11/2022 that showed evidence of mild-moderate obstructive sleep apnea, average AHI was 14/hour with severe oxygen desaturation to 76% (average 92%).  We reviewed risks of untreated sleep apnea and treatment options including weight loss, oral appliance, CPAP therapy and referral to ENT for possible surgical options. Recommending patient be started on auto CPAP due to nocturnal hypoxemia.  And is in agreement with plan.  Will place DME order for patient to receive auto CPAP 15 cm H2O with mask of choice.  Encouraged weight loss efforts and side sleeping position.  Advised against driving if experiencing excessive daytime sleepiness or fatigue.  Follow-up 31 to 90 days after CPAP start for compliance check.  Overnight oximetry test once established on CPAP to ensure patient does not need nocturnal oxygen.

## 2023-01-02 NOTE — Patient Instructions (Addendum)
Sleep study in January 2024 showed evidence of mild obstructive sleep apnea, you had on average 14 times an hour that you stop breathing for 10 seconds or longer.  The lowest your oxygen dropped was 76% with an average of 92%.  Treatment options include weight loss, oral appliance, CPAP therapy or referral to ENT for possible surgical options. Recommending you be started on auto CPAP due to apneas and hypoxemia. Once you receive CPAP unit aim to wear it nightly when sleeping for 4 to 6 hours or longer  Orders: Auto CPAP 5-15cm h20 with mask of choice, enroll in [facility]   Follow-up:  You will need a follow-up in 1-3 months for CPAP compliance check in/ please schedule on your way our today or call back to schedule    CPAP and BIPAP Information CPAP and BIPAP are methods that use air pressure to keep your airways open and to help you breathe well. CPAP and BIPAP use different amounts of pressure. Your health care provider will tell you whether CPAP or BIPAP would be more helpful for you. CPAP stands for "continuous positive airway pressure." With CPAP, the amount of pressure stays the same while you breathe in (inhale) and out (exhale). BIPAP stands for "bi-level positive airway pressure." With BIPAP, the amount of pressure will be higher when you inhale and lower when you exhale. This allows you to take larger breaths. CPAP or BIPAP may be used in the hospital, or your health care provider may want you to use it at home. You may need to have a sleep study before your health care provider can order a machine for you to use at home. What are the advantages? CPAP or BIPAP can be helpful if you have: Sleep apnea. Chronic obstructive pulmonary disease (COPD). Heart failure. Medical conditions that cause muscle weakness, including muscular dystrophy or amyotrophic lateral sclerosis (ALS). Other problems that cause breathing to be shallow, weak, abnormal, or difficult. CPAP and BIPAP are most commonly  used for obstructive sleep apnea (OSA) to keep the airways from collapsing when the muscles relax during sleep. What are the risks? Generally, this is a safe treatment. However, problems may occur, including: Irritated skin or skin sores if the mask does not fit properly. Dry or stuffy nose or nosebleeds. Dry mouth. Feeling gassy or bloated. Sinus or lung infection if the equipment is not cleaned properly. When should CPAP or BIPAP be used? In most cases, the mask only needs to be worn during sleep. Generally, the mask needs to be worn throughout the night and during any daytime naps. People with certain medical conditions may also need to wear the mask at other times, such as when they are awake. Follow instructions from your health care provider about when to use the machine. What happens during CPAP or BIPAP?  Both CPAP and BIPAP are provided by a small machine with a flexible plastic tube that attaches to a plastic mask that you wear. Air is blown through the mask into your nose or mouth. The amount of pressure that is used to blow the air can be adjusted on the machine. Your health care provider will set the pressure setting and help you find the best mask for you. Tips for using the mask Because the mask needs to be snug, some people feel trapped or closed-in (claustrophobic) when first using the mask. If you feel this way, you may need to get used to the mask. One way to do this is to hold the  mask loosely over your nose or mouth and then gradually apply the mask more snugly. You can also gradually increase the amount of time that you use the mask. Masks are available in various types and sizes. If your mask does not fit well, talk with your health care provider about getting a different one. Some common types of masks include: Full face masks, which fit over the mouth and nose. Nasal masks, which fit over the nose. Nasal pillow or prong masks, which fit into the nostrils. If you are using  a mask that fits over your nose and you tend to breathe through your mouth, a chin strap may be applied to help keep your mouth closed. Use a skin barrier to protect your skin as told by your health care provider. Some CPAP and BIPAP machines have alarms that may sound if the mask comes off or develops a leak. If you have trouble with the mask, it is very important that you talk with your health care provider about finding a way to make the mask easier to tolerate. Do not stop using the mask. There could be a negative impact on your health if you stop using the mask. Tips for using the machine Place your CPAP or BIPAP machine on a secure table or stand near an electrical outlet. Know where the on/off switch is on the machine. Follow instructions from your health care provider about how to set the pressure on your machine and when you should use it. Do not eat or drink while the CPAP or BIPAP machine is on. Food or fluids could get pushed into your lungs by the pressure of the CPAP or BIPAP. For home use, CPAP and BIPAP machines can be rented or purchased through home health care companies. Many different brands of machines are available. Renting a machine before purchasing may help you find out which particular machine works well for you. Your health insurance company may also decide which machine you may get. Keep the CPAP or BIPAP machine and attachments clean. Ask your health care provider for specific instructions. Check the humidifier if you have a dry stuffy nose or nosebleeds. Make sure it is working correctly. Follow these instructions at home: Take over-the-counter and prescription medicines only as told by your health care provider. Ask if you can take sinus medicine if your sinuses are blocked. Do not use any products that contain nicotine or tobacco. These products include cigarettes, chewing tobacco, and vaping devices, such as e-cigarettes. If you need help quitting, ask your health care  provider. Keep all follow-up visits. This is important. Contact a health care provider if: You have redness or pressure sores on your head, face, mouth, or nose from the mask or head gear. You have trouble using the CPAP or BIPAP machine. You cannot tolerate wearing the CPAP or BIPAP mask. Someone tells you that you snore even when wearing your CPAP or BIPAP. Get help right away if: You have trouble breathing. You feel confused. Summary CPAP and BIPAP are methods that use air pressure to keep your airways open and to help you breathe well. If you have trouble with the mask, it is very important that you talk with your health care provider about finding a way to make the mask easier to tolerate. Do not stop using the mask. There could be a negative impact to your health if you stop using the mask. Follow instructions from your health care provider about when to use the machine. This information  is not intended to replace advice given to you by your health care provider. Make sure you discuss any questions you have with your health care provider. Document Revised: 04/21/2021 Document Reviewed: 08/21/2020 Elsevier Patient Education  2023 [employer].

## 2023-01-10 DIAGNOSIS — G4733 Obstructive sleep apnea (adult) (pediatric): Secondary | ICD-10-CM | POA: Diagnosis not present

## 2023-01-26 ENCOUNTER — Institutional Professional Consult (permissible substitution): Payer: Medicare Other | Admitting: Internal Medicine

## 2023-02-09 DIAGNOSIS — G4733 Obstructive sleep apnea (adult) (pediatric): Secondary | ICD-10-CM | POA: Diagnosis not present

## 2023-03-12 DIAGNOSIS — G4733 Obstructive sleep apnea (adult) (pediatric): Secondary | ICD-10-CM | POA: Diagnosis not present

## 2023-03-20 DIAGNOSIS — J302 Other seasonal allergic rhinitis: Secondary | ICD-10-CM | POA: Diagnosis not present

## 2023-03-20 DIAGNOSIS — I10 Essential (primary) hypertension: Secondary | ICD-10-CM | POA: Diagnosis not present

## 2023-03-20 DIAGNOSIS — G4730 Sleep apnea, unspecified: Secondary | ICD-10-CM | POA: Diagnosis not present

## 2023-03-20 DIAGNOSIS — R413 Other amnesia: Secondary | ICD-10-CM | POA: Diagnosis not present

## 2023-03-20 DIAGNOSIS — N3281 Overactive bladder: Secondary | ICD-10-CM | POA: Diagnosis not present

## 2023-03-20 DIAGNOSIS — E785 Hyperlipidemia, unspecified: Secondary | ICD-10-CM | POA: Diagnosis not present

## 2023-03-20 DIAGNOSIS — E1169 Type 2 diabetes mellitus with other specified complication: Secondary | ICD-10-CM | POA: Diagnosis not present

## 2023-03-28 DIAGNOSIS — J309 Allergic rhinitis, unspecified: Secondary | ICD-10-CM | POA: Diagnosis not present

## 2023-03-28 DIAGNOSIS — E1169 Type 2 diabetes mellitus with other specified complication: Secondary | ICD-10-CM | POA: Diagnosis not present

## 2023-03-28 DIAGNOSIS — R21 Rash and other nonspecific skin eruption: Secondary | ICD-10-CM | POA: Diagnosis not present

## 2023-04-03 IMAGING — DX DG KNEE COMPLETE 4+V*R*
4 series · 4 of 4 positions shown · non-contrast
Comparison: None.

CLINICAL DATA: Right knee pain and swelling.

EXAM:
RIGHT KNEE - COMPLETE 4+ VIEW

[knee ap]
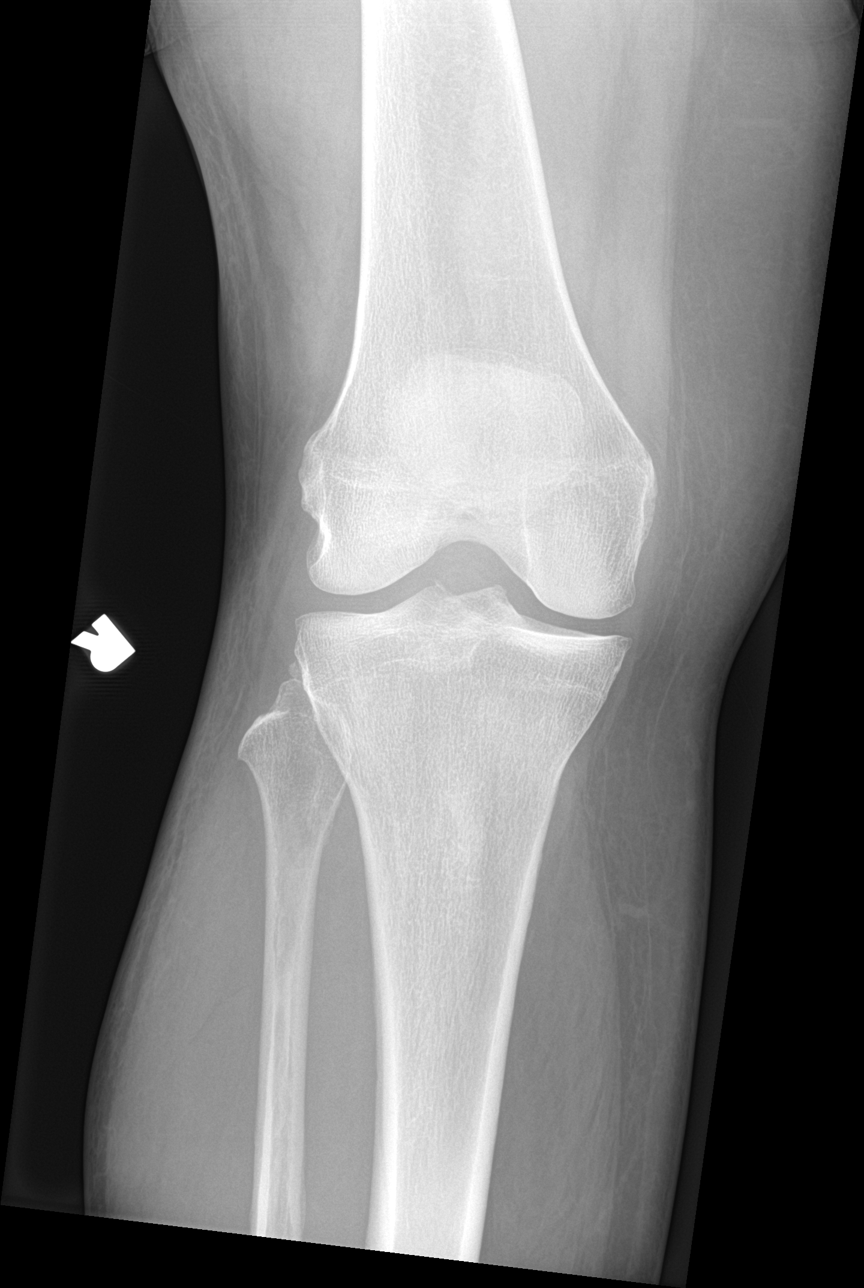

[knee lat]
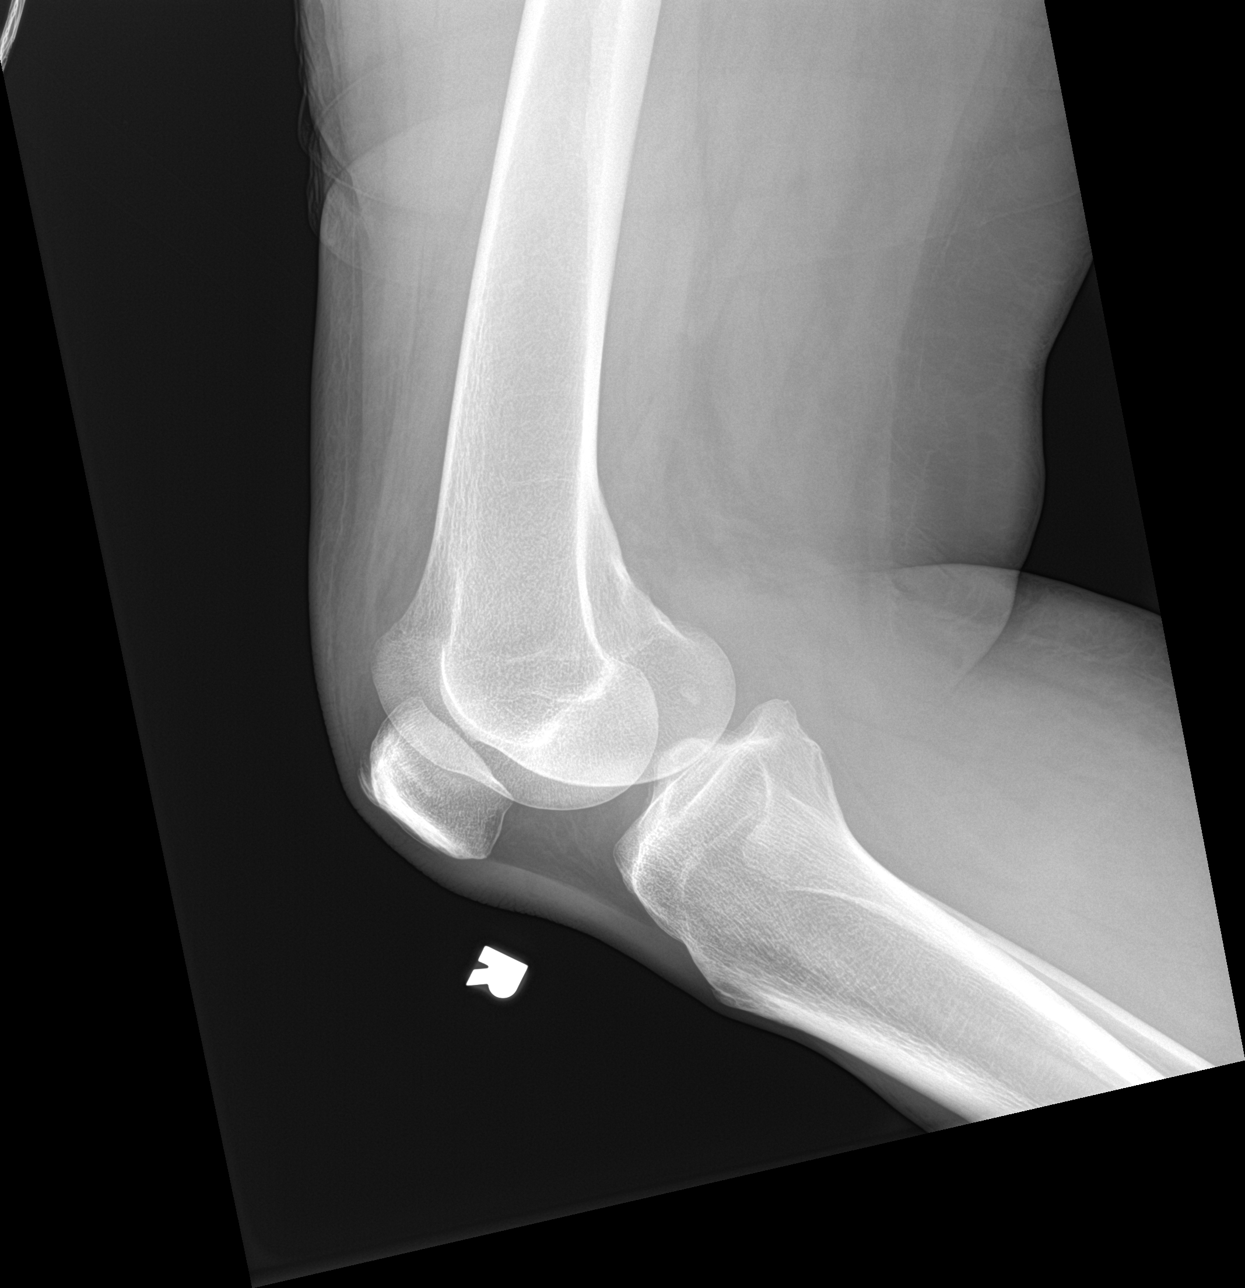

[knee obl (1 of 2)]
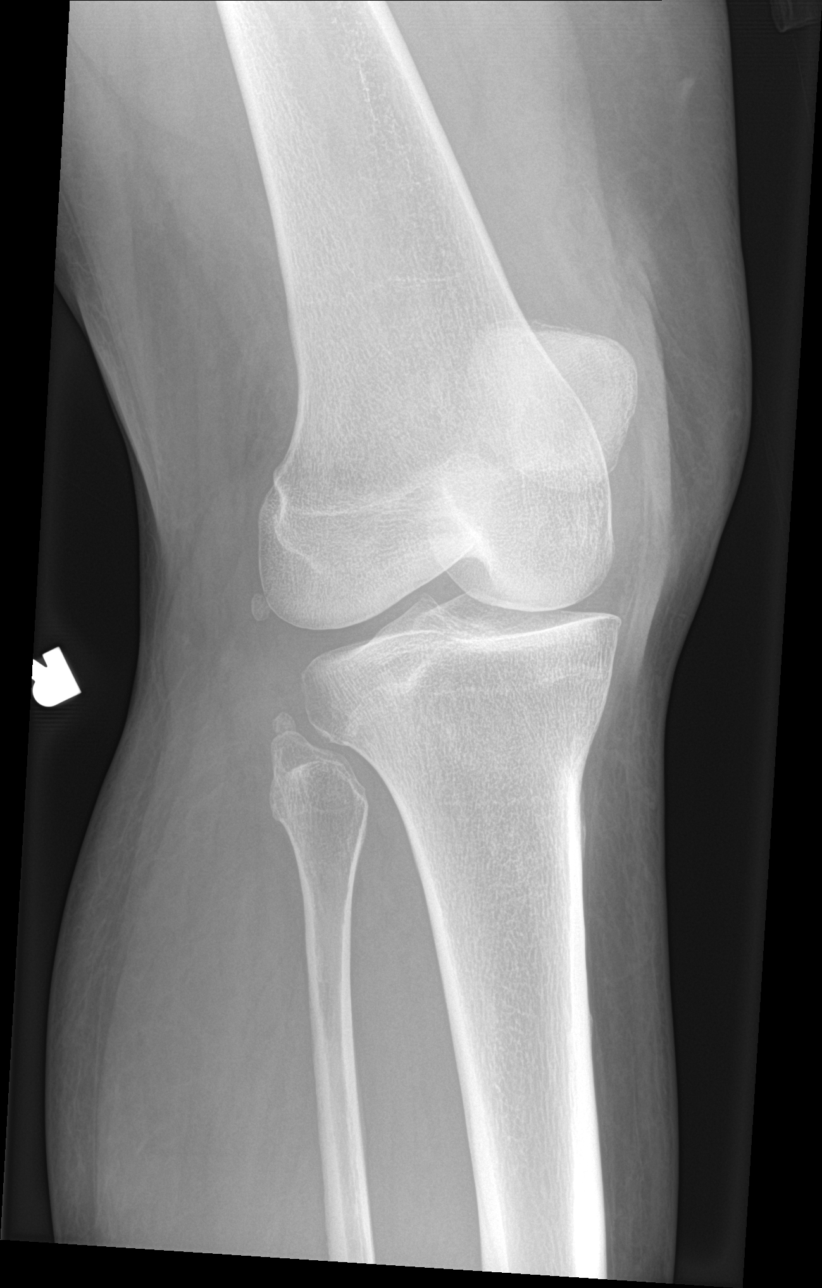

[knee obl (2 of 2)]
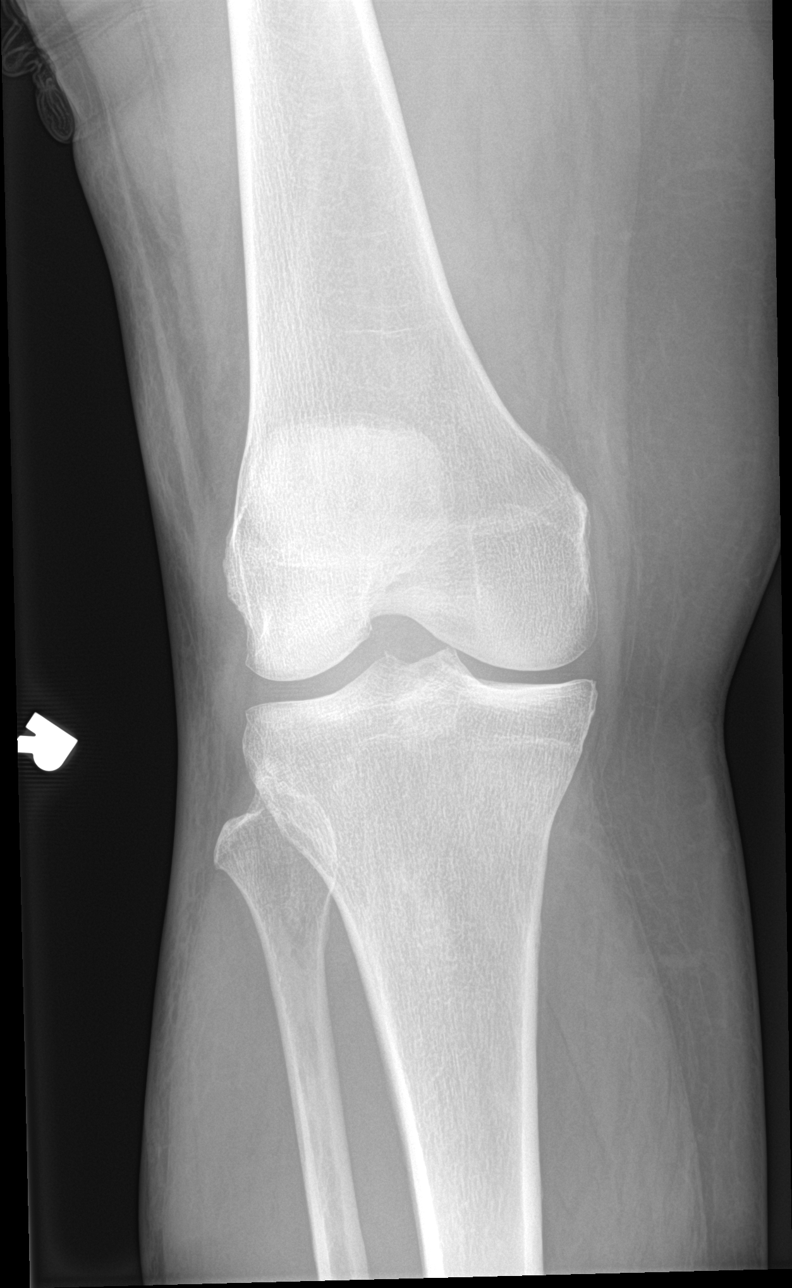

[4 of 4 positions shown; findings below may reference images not displayed]

FINDINGS: No evidence of fracture, dislocation, or joint effusion. Mild
enthesopathic changes seen along the superior margin of the patella.
No evidence of arthropathy or other focal bone abnormality. Soft
tissues are unremarkable.
IMPRESSION: No acute findings. Mild patellar enthesopathy.

## 2023-04-03 IMAGING — US US EXTREM LOW VENOUS*R*
1 series · 14 of 24 positions shown · non-contrast
Comparison: None.

CLINICAL DATA: Right leg pain and swelling

EXAM:
RIGHT LOWER EXTREMITY VENOUS DOPPLER ULTRASOUND
TECHNIQUE: Gray-scale sonography with compression, as well as color and duplex
ultrasound, were performed to evaluate the deep venous system(s)
from the level of the common femoral vein through the popliteal and
proximal calf veins.

[Series 1: us extrem low venous*right* · 14 of 29 slices shown]
[im 1/29]
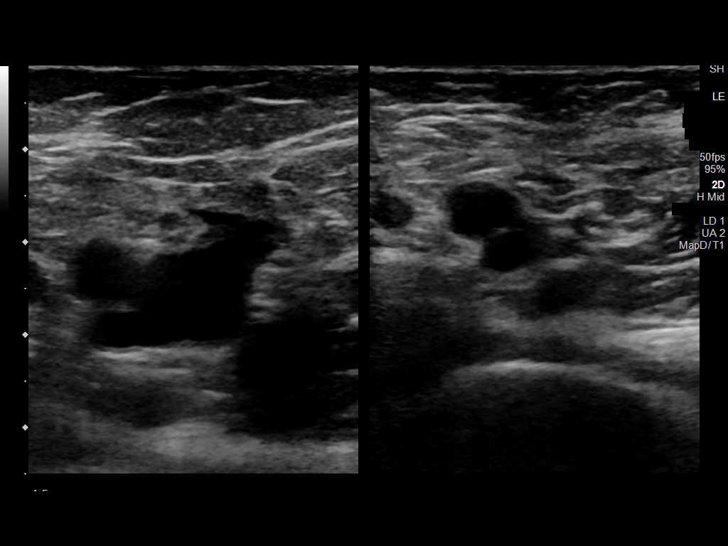
[im 3/29]
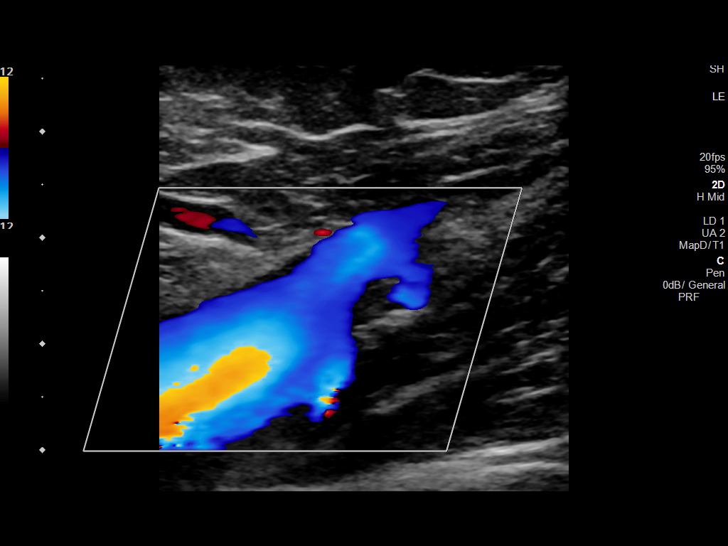
[im 5/29]
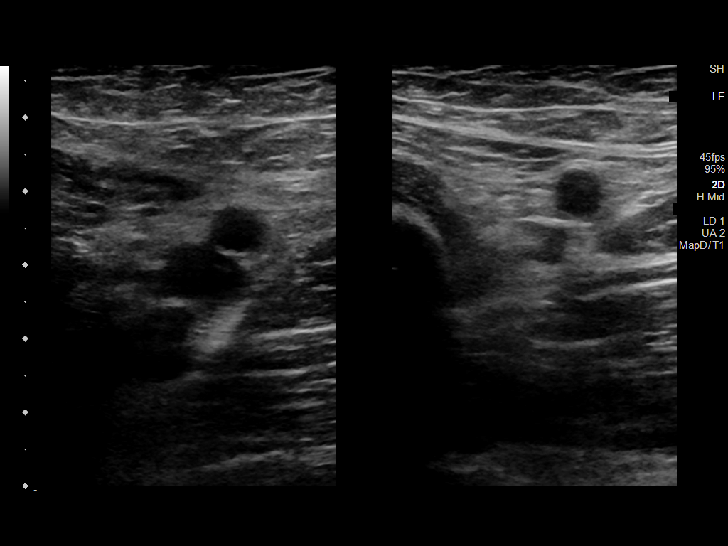
[im 8/29]
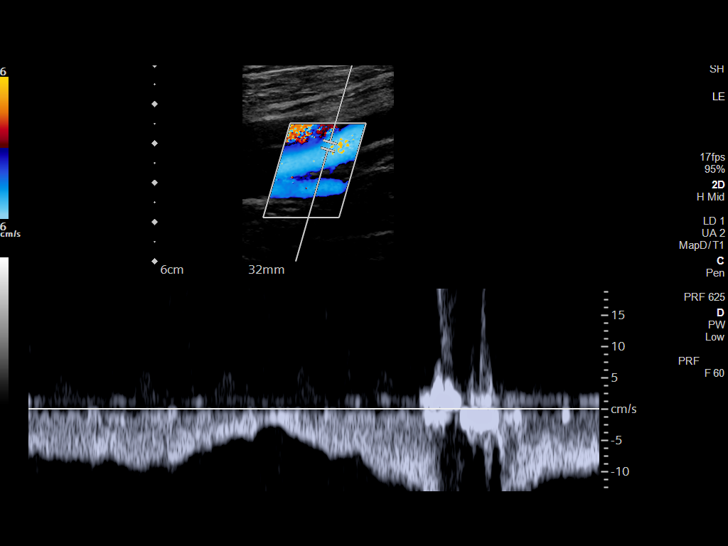
[im 9/29]
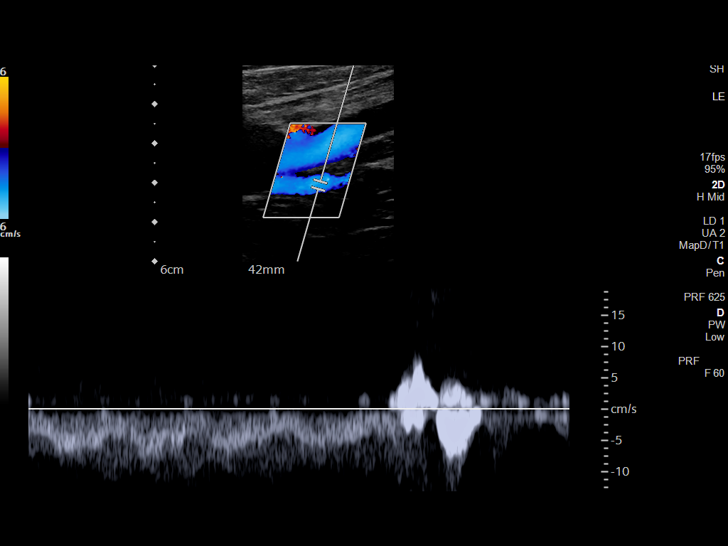
[im 11/29]
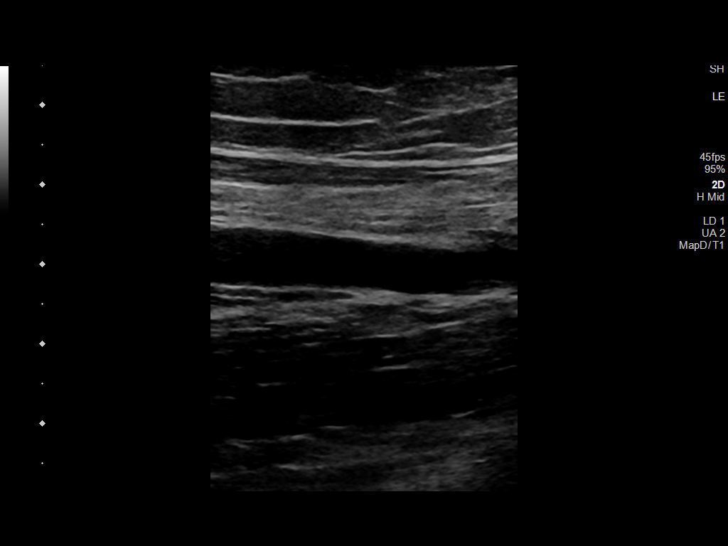
[im 14/29]
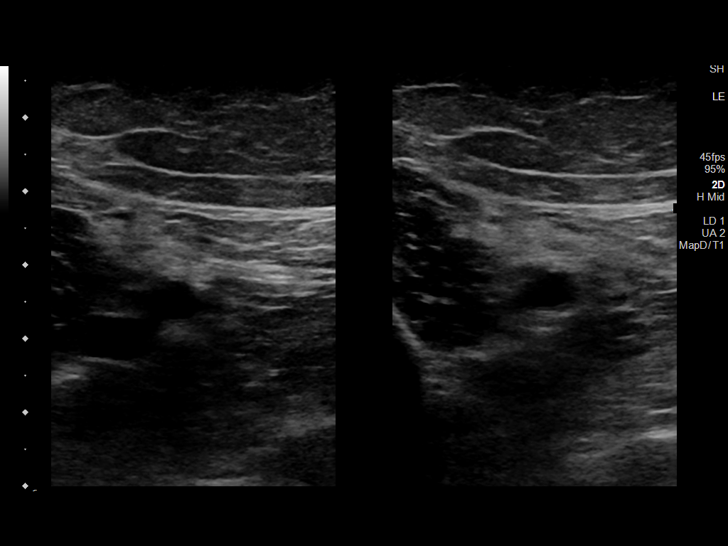
[im 15/29]
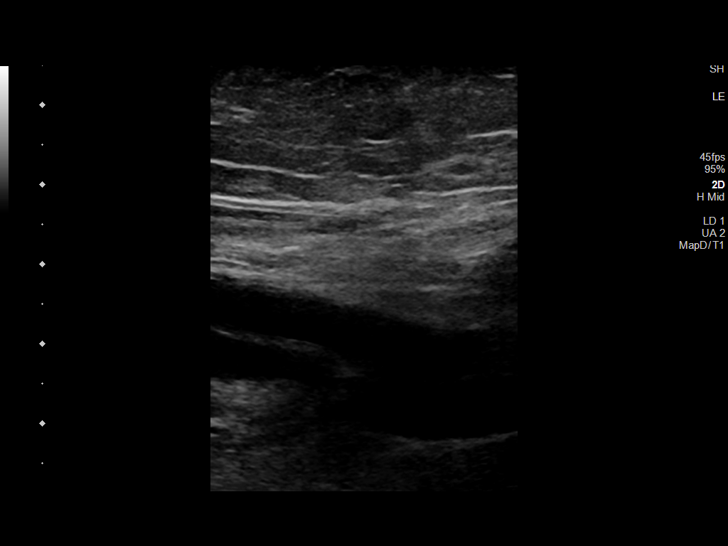
[im 18/29]
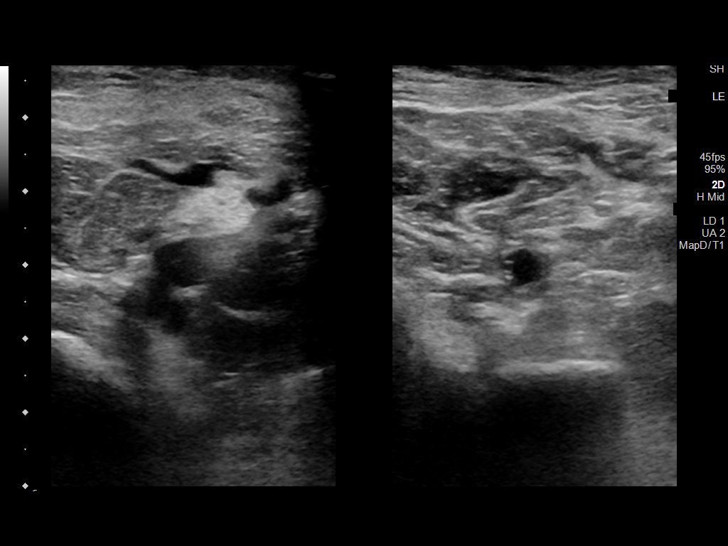
[im 20/29]
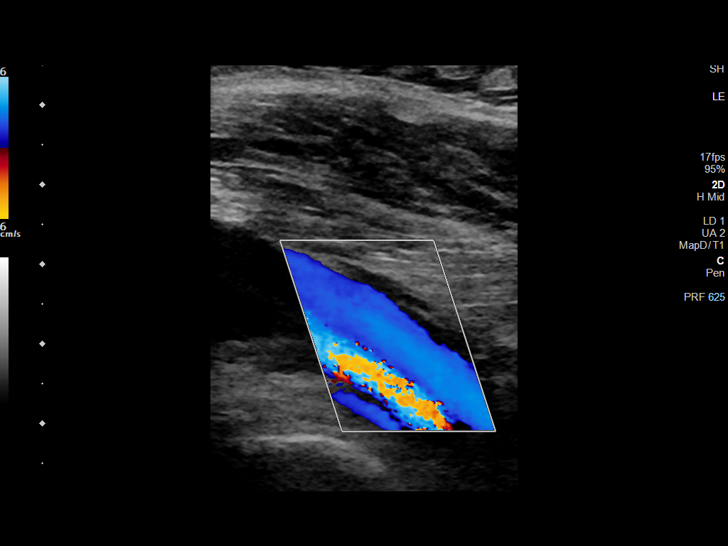
[im 22/29]
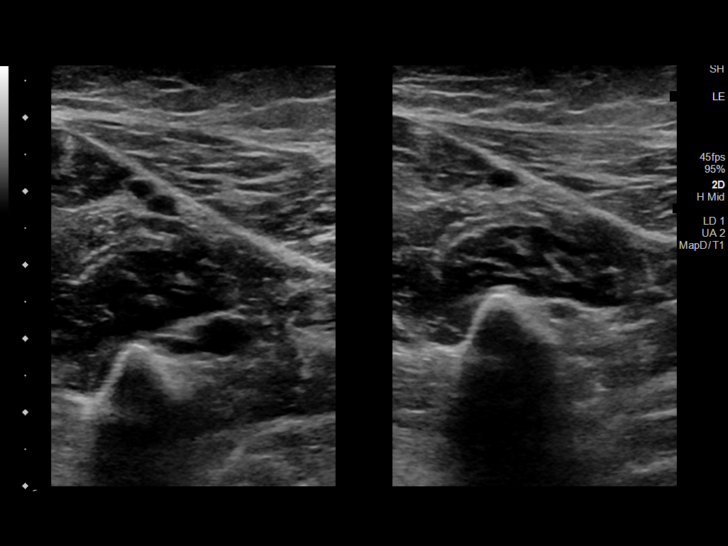
[im 24/29]
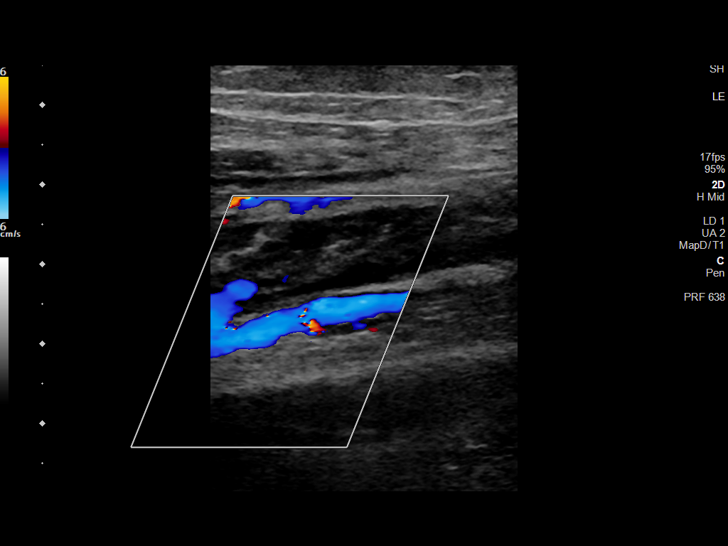
[im 26/29]
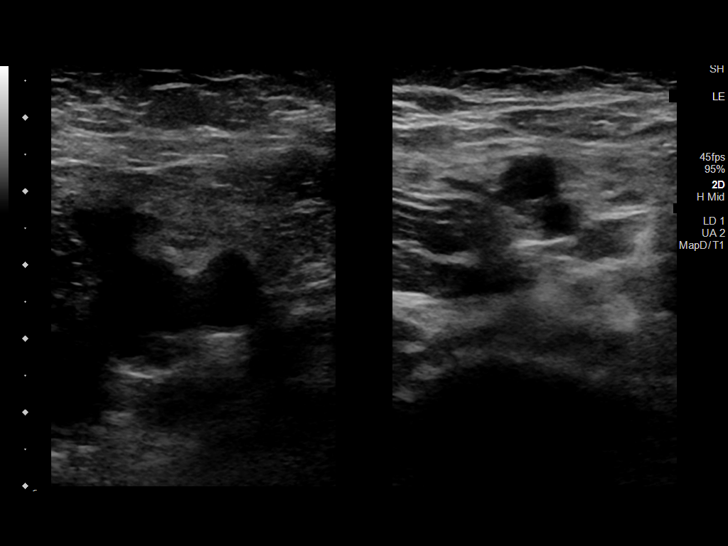
[im 29/29]
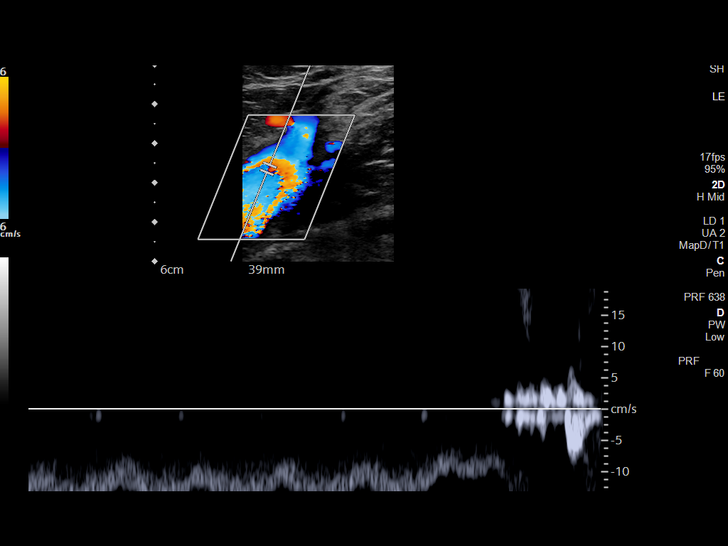

[14 of 24 positions shown; findings below may reference images not displayed]

FINDINGS: VENOUS

Normal compressibility of the common femoral, superficial femoral,
and popliteal veins, as well as the visualized calf veins.
Visualized portions of profunda femoral vein and great saphenous
vein unremarkable. No filling defects to suggest DVT on grayscale or
color Doppler imaging. Doppler waveforms show normal direction of
venous flow, normal respiratory plasticity and response to
augmentation.

Limited views of the contralateral common femoral vein are
unremarkable.

OTHER

None.

Limitations: none
IMPRESSION: Negative.

## 2023-04-11 DIAGNOSIS — G4733 Obstructive sleep apnea (adult) (pediatric): Secondary | ICD-10-CM | POA: Diagnosis not present

## 2023-04-12 DIAGNOSIS — Z0000 Encounter for general adult medical examination without abnormal findings: Secondary | ICD-10-CM | POA: Diagnosis not present

## 2023-04-12 DIAGNOSIS — Z9181 History of falling: Secondary | ICD-10-CM | POA: Diagnosis not present

## 2023-04-14 DIAGNOSIS — G4733 Obstructive sleep apnea (adult) (pediatric): Secondary | ICD-10-CM | POA: Diagnosis not present

## 2023-04-19 DIAGNOSIS — I10 Essential (primary) hypertension: Secondary | ICD-10-CM | POA: Diagnosis not present

## 2023-04-19 DIAGNOSIS — E139 Other specified diabetes mellitus without complications: Secondary | ICD-10-CM | POA: Diagnosis not present

## 2023-05-01 DIAGNOSIS — L811 Chloasma: Secondary | ICD-10-CM | POA: Diagnosis not present

## 2023-05-11 DIAGNOSIS — Z1231 Encounter for screening mammogram for malignant neoplasm of breast: Secondary | ICD-10-CM | POA: Diagnosis not present

## 2023-05-11 DIAGNOSIS — M85851 Other specified disorders of bone density and structure, right thigh: Secondary | ICD-10-CM | POA: Diagnosis not present

## 2023-05-12 DIAGNOSIS — G4733 Obstructive sleep apnea (adult) (pediatric): Secondary | ICD-10-CM | POA: Diagnosis not present

## 2023-05-27 DIAGNOSIS — E119 Type 2 diabetes mellitus without complications: Secondary | ICD-10-CM | POA: Diagnosis not present

## 2023-05-27 DIAGNOSIS — I10 Essential (primary) hypertension: Secondary | ICD-10-CM | POA: Diagnosis not present

## 2023-06-19 DIAGNOSIS — I10 Essential (primary) hypertension: Secondary | ICD-10-CM | POA: Diagnosis not present

## 2023-06-19 DIAGNOSIS — N3281 Overactive bladder: Secondary | ICD-10-CM | POA: Diagnosis not present

## 2023-06-19 DIAGNOSIS — K219 Gastro-esophageal reflux disease without esophagitis: Secondary | ICD-10-CM | POA: Diagnosis not present

## 2023-06-19 DIAGNOSIS — J302 Other seasonal allergic rhinitis: Secondary | ICD-10-CM | POA: Diagnosis not present

## 2023-06-19 DIAGNOSIS — R413 Other amnesia: Secondary | ICD-10-CM | POA: Diagnosis not present

## 2023-06-19 DIAGNOSIS — E1169 Type 2 diabetes mellitus with other specified complication: Secondary | ICD-10-CM | POA: Diagnosis not present

## 2023-06-19 DIAGNOSIS — E785 Hyperlipidemia, unspecified: Secondary | ICD-10-CM | POA: Diagnosis not present

## 2023-06-19 DIAGNOSIS — G4730 Sleep apnea, unspecified: Secondary | ICD-10-CM | POA: Diagnosis not present

## 2023-06-20 DIAGNOSIS — E785 Hyperlipidemia, unspecified: Secondary | ICD-10-CM | POA: Diagnosis not present

## 2023-06-20 DIAGNOSIS — E1169 Type 2 diabetes mellitus with other specified complication: Secondary | ICD-10-CM | POA: Diagnosis not present

## 2023-06-20 DIAGNOSIS — Z79899 Other long term (current) drug therapy: Secondary | ICD-10-CM | POA: Diagnosis not present

## 2023-06-20 DIAGNOSIS — E559 Vitamin D deficiency, unspecified: Secondary | ICD-10-CM | POA: Diagnosis not present

## 2023-06-26 DIAGNOSIS — E119 Type 2 diabetes mellitus without complications: Secondary | ICD-10-CM | POA: Diagnosis not present

## 2023-06-26 DIAGNOSIS — I10 Essential (primary) hypertension: Secondary | ICD-10-CM | POA: Diagnosis not present

## 2023-07-06 DIAGNOSIS — E876 Hypokalemia: Secondary | ICD-10-CM | POA: Diagnosis not present

## 2023-07-06 DIAGNOSIS — E1169 Type 2 diabetes mellitus with other specified complication: Secondary | ICD-10-CM | POA: Diagnosis not present

## 2023-07-27 DIAGNOSIS — E119 Type 2 diabetes mellitus without complications: Secondary | ICD-10-CM | POA: Diagnosis not present

## 2023-07-27 DIAGNOSIS — I10 Essential (primary) hypertension: Secondary | ICD-10-CM | POA: Diagnosis not present

## 2023-08-03 DIAGNOSIS — H2513 Age-related nuclear cataract, bilateral: Secondary | ICD-10-CM | POA: Diagnosis not present

## 2023-08-26 DIAGNOSIS — E119 Type 2 diabetes mellitus without complications: Secondary | ICD-10-CM | POA: Diagnosis not present

## 2023-08-26 DIAGNOSIS — I10 Essential (primary) hypertension: Secondary | ICD-10-CM | POA: Diagnosis not present

## 2023-09-04 DIAGNOSIS — E119 Type 2 diabetes mellitus without complications: Secondary | ICD-10-CM | POA: Diagnosis not present

## 2023-09-04 DIAGNOSIS — I10 Essential (primary) hypertension: Secondary | ICD-10-CM | POA: Diagnosis not present

## 2023-09-06 DIAGNOSIS — R413 Other amnesia: Secondary | ICD-10-CM | POA: Diagnosis not present

## 2023-09-06 DIAGNOSIS — E785 Hyperlipidemia, unspecified: Secondary | ICD-10-CM | POA: Diagnosis not present

## 2023-09-06 DIAGNOSIS — G4730 Sleep apnea, unspecified: Secondary | ICD-10-CM | POA: Diagnosis not present

## 2023-09-06 DIAGNOSIS — I10 Essential (primary) hypertension: Secondary | ICD-10-CM | POA: Diagnosis not present

## 2023-09-06 DIAGNOSIS — E559 Vitamin D deficiency, unspecified: Secondary | ICD-10-CM | POA: Diagnosis not present

## 2023-09-06 DIAGNOSIS — N3281 Overactive bladder: Secondary | ICD-10-CM | POA: Diagnosis not present

## 2023-09-06 DIAGNOSIS — E1169 Type 2 diabetes mellitus with other specified complication: Secondary | ICD-10-CM | POA: Diagnosis not present

## 2023-09-06 DIAGNOSIS — K219 Gastro-esophageal reflux disease without esophagitis: Secondary | ICD-10-CM | POA: Diagnosis not present

## 2023-09-06 DIAGNOSIS — J302 Other seasonal allergic rhinitis: Secondary | ICD-10-CM | POA: Diagnosis not present

## 2023-10-27 DIAGNOSIS — E119 Type 2 diabetes mellitus without complications: Secondary | ICD-10-CM | POA: Diagnosis not present

## 2023-10-27 DIAGNOSIS — I10 Essential (primary) hypertension: Secondary | ICD-10-CM | POA: Diagnosis not present

## 2023-11-01 DIAGNOSIS — L811 Chloasma: Secondary | ICD-10-CM | POA: Diagnosis not present

## 2023-11-03 DIAGNOSIS — I10 Essential (primary) hypertension: Secondary | ICD-10-CM | POA: Diagnosis not present

## 2023-11-03 DIAGNOSIS — E119 Type 2 diabetes mellitus without complications: Secondary | ICD-10-CM | POA: Diagnosis not present

## 2023-11-20 DIAGNOSIS — L658 Other specified nonscarring hair loss: Secondary | ICD-10-CM | POA: Diagnosis not present

## 2023-12-25 DIAGNOSIS — I10 Essential (primary) hypertension: Secondary | ICD-10-CM | POA: Diagnosis not present

## 2023-12-25 DIAGNOSIS — E119 Type 2 diabetes mellitus without complications: Secondary | ICD-10-CM | POA: Diagnosis not present

## 2024-01-02 DIAGNOSIS — E119 Type 2 diabetes mellitus without complications: Secondary | ICD-10-CM | POA: Diagnosis not present

## 2024-01-02 DIAGNOSIS — I10 Essential (primary) hypertension: Secondary | ICD-10-CM | POA: Diagnosis not present

## 2024-01-04 DIAGNOSIS — I10 Essential (primary) hypertension: Secondary | ICD-10-CM | POA: Diagnosis not present

## 2024-01-04 DIAGNOSIS — G4730 Sleep apnea, unspecified: Secondary | ICD-10-CM | POA: Diagnosis not present

## 2024-01-04 DIAGNOSIS — E559 Vitamin D deficiency, unspecified: Secondary | ICD-10-CM | POA: Diagnosis not present

## 2024-01-04 DIAGNOSIS — R413 Other amnesia: Secondary | ICD-10-CM | POA: Diagnosis not present

## 2024-01-04 DIAGNOSIS — E1169 Type 2 diabetes mellitus with other specified complication: Secondary | ICD-10-CM | POA: Diagnosis not present

## 2024-01-04 DIAGNOSIS — N3281 Overactive bladder: Secondary | ICD-10-CM | POA: Diagnosis not present

## 2024-01-04 DIAGNOSIS — J302 Other seasonal allergic rhinitis: Secondary | ICD-10-CM | POA: Diagnosis not present

## 2024-01-04 DIAGNOSIS — E785 Hyperlipidemia, unspecified: Secondary | ICD-10-CM | POA: Diagnosis not present

## 2024-01-04 DIAGNOSIS — Z79899 Other long term (current) drug therapy: Secondary | ICD-10-CM | POA: Diagnosis not present

## 2024-01-24 DIAGNOSIS — I10 Essential (primary) hypertension: Secondary | ICD-10-CM | POA: Diagnosis not present

## 2024-01-24 DIAGNOSIS — E119 Type 2 diabetes mellitus without complications: Secondary | ICD-10-CM | POA: Diagnosis not present

## 2024-02-01 DIAGNOSIS — I10 Essential (primary) hypertension: Secondary | ICD-10-CM | POA: Diagnosis not present

## 2024-02-01 DIAGNOSIS — E119 Type 2 diabetes mellitus without complications: Secondary | ICD-10-CM | POA: Diagnosis not present

## 2024-02-24 DIAGNOSIS — E119 Type 2 diabetes mellitus without complications: Secondary | ICD-10-CM | POA: Diagnosis not present

## 2024-02-24 DIAGNOSIS — I10 Essential (primary) hypertension: Secondary | ICD-10-CM | POA: Diagnosis not present

## 2024-03-02 DIAGNOSIS — E119 Type 2 diabetes mellitus without complications: Secondary | ICD-10-CM | POA: Diagnosis not present

## 2024-03-02 DIAGNOSIS — I10 Essential (primary) hypertension: Secondary | ICD-10-CM | POA: Diagnosis not present

## 2024-03-25 DIAGNOSIS — I10 Essential (primary) hypertension: Secondary | ICD-10-CM | POA: Diagnosis not present

## 2024-03-25 DIAGNOSIS — E119 Type 2 diabetes mellitus without complications: Secondary | ICD-10-CM | POA: Diagnosis not present

## 2024-04-01 DIAGNOSIS — I10 Essential (primary) hypertension: Secondary | ICD-10-CM | POA: Diagnosis not present

## 2024-04-01 DIAGNOSIS — E119 Type 2 diabetes mellitus without complications: Secondary | ICD-10-CM | POA: Diagnosis not present

## 2024-04-09 DIAGNOSIS — E119 Type 2 diabetes mellitus without complications: Secondary | ICD-10-CM | POA: Diagnosis not present

## 2024-04-11 DIAGNOSIS — J302 Other seasonal allergic rhinitis: Secondary | ICD-10-CM | POA: Diagnosis not present

## 2024-04-11 DIAGNOSIS — I10 Essential (primary) hypertension: Secondary | ICD-10-CM | POA: Diagnosis not present

## 2024-04-11 DIAGNOSIS — E785 Hyperlipidemia, unspecified: Secondary | ICD-10-CM | POA: Diagnosis not present

## 2024-04-11 DIAGNOSIS — N3281 Overactive bladder: Secondary | ICD-10-CM | POA: Diagnosis not present

## 2024-04-11 DIAGNOSIS — R413 Other amnesia: Secondary | ICD-10-CM | POA: Diagnosis not present

## 2024-04-11 DIAGNOSIS — E1169 Type 2 diabetes mellitus with other specified complication: Secondary | ICD-10-CM | POA: Diagnosis not present

## 2024-04-11 DIAGNOSIS — G4730 Sleep apnea, unspecified: Secondary | ICD-10-CM | POA: Diagnosis not present

## 2024-04-11 DIAGNOSIS — E559 Vitamin D deficiency, unspecified: Secondary | ICD-10-CM | POA: Diagnosis not present

## 2024-04-25 DIAGNOSIS — I10 Essential (primary) hypertension: Secondary | ICD-10-CM | POA: Diagnosis not present

## 2024-04-25 DIAGNOSIS — E119 Type 2 diabetes mellitus without complications: Secondary | ICD-10-CM | POA: Diagnosis not present

## 2024-04-29 DIAGNOSIS — E119 Type 2 diabetes mellitus without complications: Secondary | ICD-10-CM | POA: Diagnosis not present

## 2024-04-29 DIAGNOSIS — I10 Essential (primary) hypertension: Secondary | ICD-10-CM | POA: Diagnosis not present

## 2024-05-01 DIAGNOSIS — E119 Type 2 diabetes mellitus without complications: Secondary | ICD-10-CM | POA: Diagnosis not present

## 2024-05-01 DIAGNOSIS — I10 Essential (primary) hypertension: Secondary | ICD-10-CM | POA: Diagnosis not present

## 2024-05-26 DIAGNOSIS — I10 Essential (primary) hypertension: Secondary | ICD-10-CM | POA: Diagnosis not present

## 2024-05-26 DIAGNOSIS — E119 Type 2 diabetes mellitus without complications: Secondary | ICD-10-CM | POA: Diagnosis not present

## 2024-05-31 DIAGNOSIS — I10 Essential (primary) hypertension: Secondary | ICD-10-CM | POA: Diagnosis not present

## 2024-05-31 DIAGNOSIS — E119 Type 2 diabetes mellitus without complications: Secondary | ICD-10-CM | POA: Diagnosis not present

## 2024-06-18 DIAGNOSIS — G4730 Sleep apnea, unspecified: Secondary | ICD-10-CM | POA: Diagnosis not present

## 2024-06-18 DIAGNOSIS — I10 Essential (primary) hypertension: Secondary | ICD-10-CM | POA: Diagnosis not present

## 2024-06-18 DIAGNOSIS — J302 Other seasonal allergic rhinitis: Secondary | ICD-10-CM | POA: Diagnosis not present

## 2024-06-18 DIAGNOSIS — E559 Vitamin D deficiency, unspecified: Secondary | ICD-10-CM | POA: Diagnosis not present

## 2024-06-18 DIAGNOSIS — E1169 Type 2 diabetes mellitus with other specified complication: Secondary | ICD-10-CM | POA: Diagnosis not present

## 2024-06-18 DIAGNOSIS — R413 Other amnesia: Secondary | ICD-10-CM | POA: Diagnosis not present

## 2024-06-18 DIAGNOSIS — K219 Gastro-esophageal reflux disease without esophagitis: Secondary | ICD-10-CM | POA: Diagnosis not present

## 2024-06-18 DIAGNOSIS — N3281 Overactive bladder: Secondary | ICD-10-CM | POA: Diagnosis not present

## 2024-06-18 DIAGNOSIS — E785 Hyperlipidemia, unspecified: Secondary | ICD-10-CM | POA: Diagnosis not present

## 2024-06-25 DIAGNOSIS — E119 Type 2 diabetes mellitus without complications: Secondary | ICD-10-CM | POA: Diagnosis not present

## 2024-06-25 DIAGNOSIS — I10 Essential (primary) hypertension: Secondary | ICD-10-CM | POA: Diagnosis not present

## 2024-06-30 DIAGNOSIS — I10 Essential (primary) hypertension: Secondary | ICD-10-CM | POA: Diagnosis not present

## 2024-06-30 DIAGNOSIS — E119 Type 2 diabetes mellitus without complications: Secondary | ICD-10-CM | POA: Diagnosis not present

## 2024-07-24 DIAGNOSIS — I10 Essential (primary) hypertension: Secondary | ICD-10-CM | POA: Diagnosis not present

## 2024-07-24 DIAGNOSIS — J029 Acute pharyngitis, unspecified: Secondary | ICD-10-CM | POA: Diagnosis not present

## 2024-07-24 DIAGNOSIS — R059 Cough, unspecified: Secondary | ICD-10-CM | POA: Diagnosis not present

## 2024-07-24 DIAGNOSIS — R197 Diarrhea, unspecified: Secondary | ICD-10-CM | POA: Diagnosis not present

## 2024-08-29 ENCOUNTER — Encounter (HOSPITAL_BASED_OUTPATIENT_CLINIC_OR_DEPARTMENT_OTHER): Payer: Self-pay | Admitting: Emergency Medicine

## 2024-08-29 ENCOUNTER — Other Ambulatory Visit: Payer: Self-pay

## 2024-08-29 ENCOUNTER — Emergency Department (HOSPITAL_BASED_OUTPATIENT_CLINIC_OR_DEPARTMENT_OTHER)
Admission: EM | Admit: 2024-08-29 | Discharge: 2024-08-29 | Disposition: A | Discharge status: Home / Self Care | Attending: Emergency Medicine | Admitting: Emergency Medicine

## 2024-08-29 DIAGNOSIS — R300 Dysuria: Secondary | ICD-10-CM | POA: Diagnosis present

## 2024-08-29 DIAGNOSIS — N3001 Acute cystitis with hematuria: Secondary | ICD-10-CM | POA: Diagnosis not present

## 2024-08-29 LAB — URINALYSIS, MICROSCOPIC (REFLEX): RBC / HPF: >50 RBC/hpf (ref 0–5)

## 2024-08-29 LAB — URINALYSIS, ROUTINE W REFLEX MICROSCOPIC

## 2024-08-29 MED ORDER — PHENAZOPYRIDINE HCL 200 MG PO TABS: 200.0000 mg | ORAL_TABLET | Freq: Three times a day (TID) | ORAL | 0 refills | Status: AC

## 2024-08-29 MED ORDER — SULFAMETHOXAZOLE-TRIMETHOPRIM 800-160 MG PO TABS
1.0000 | ORAL_TABLET | Freq: Once | ORAL | Status: AC
  Administered 2024-08-29: 1 via ORAL
  Filled 2024-08-29: qty 1

## 2024-08-29 MED ORDER — SULFAMETHOXAZOLE-TRIMETHOPRIM 800-160 MG PO TABS: 1.0000 | ORAL_TABLET | Freq: Two times a day (BID) | ORAL | 0 refills | Status: AC

## 2024-08-29 NOTE — ED Notes (Signed)
 Reviewed AVS/discharge instructions with patient. Time allotted for and all questions answered. Patient is agreeable for d/c and escorted to ED exit by staff.

## 2024-08-29 NOTE — ED Triage Notes (Signed)
 Pt c/o dysuria and hematuria today.  Denies fever.

## 2024-08-29 NOTE — ED Provider Notes (Signed)
 [place] [facility] Provider Note   CSN: [id number] Arrival date & time: 08/29/24  1413     Patient presents with: Dysuria   Heather Woods is a 73 y.o. female.   Patient who is not on blood thinners presents to the emergency department today for evaluation of dysuria that started yesterday.  She has also noted blood in the urine, worsening today.  She has had increased frequency and urgency.  No fevers, flank pain, vomiting.  No treatments prior to arrival.  She has never had symptoms like this in the past.  Stinging pain with urination.       Prior to Admission medications   Medication Sig Start Date End Date Taking? Authorizing Provider  amLODipine  (NORVASC ) 10 MG tablet Take 1 tablet (10 mg total) by mouth daily. 08/15/22   Molpus, John, MD  chlorthalidone  (HYGROTON ) 25 MG tablet Take 0.5 tablets (12.5 mg total) by mouth daily. 08/15/22 11/13/22  Revankar, Rajan R, MD  fluticasone (FLONASE) 50 MCG/ACT nasal spray Place 2 sprays into both nostrils as needed for rhinitis. 10/05/20   [provider]  losartan  (COZAAR ) 100 MG tablet Take 1 tablet (100 mg total) by mouth daily. 08/12/22   Blue, Soijett A, PA-C  montelukast (SINGULAIR) 10 MG tablet Take 10 mg by mouth at bedtime. 07/25/22   [provider]  oxybutynin (DITROPAN) 5 MG tablet Take 5 mg by mouth 2 (two) times daily. 08/15/22   [provider]  rosuvastatin (CRESTOR) 20 MG tablet Take 20 mg by mouth daily. 07/05/22   [provider]  sertraline (ZOLOFT) 100 MG tablet Take 100 mg by mouth daily.     [provider]  Vitamin D, Ergocalciferol, (DRISDOL) 1.25 MG (50000 UNIT) CAPS capsule Take 50,000 Units by mouth once a week. 07/05/22   [provider]    Allergies: Lisinopril and Penicillins    Review of Systems  Updated Vital Signs BP (!) 151/87 (BP Location: Right Arm)   Pulse 98   Temp 98.6 F (37 C) (Oral)   Resp 16   Ht  5' 3 (1.6 m)   Wt 68 kg   SpO2 98%   BMI 26.57 kg/m   Physical Exam Vitals and nursing note reviewed.  Constitutional:      Appearance: She is well-developed.  HENT:     Head: Normocephalic and atraumatic.  Eyes:     Conjunctiva/sclera: Conjunctivae normal.  Pulmonary:     Effort: No respiratory distress.  Musculoskeletal:     Cervical back: Normal range of motion and neck supple.  Skin:    General: Skin is warm and dry.  Neurological:     Mental Status: She is alert.     (all labs ordered are listed, but only abnormal results are displayed) Labs Reviewed  URINALYSIS, ROUTINE W REFLEX MICROSCOPIC - Abnormal; Notable for the following components:      Result Value   Color, Urine RED (*)    APPearance TURBID (*)    Glucose, UA   (*)    Value: TEST NOT REPORTED DUE TO COLOR INTERFERENCE OF URINE PIGMENT   Hgb urine dipstick   (*)    Value: TEST NOT REPORTED DUE TO COLOR INTERFERENCE OF URINE PIGMENT   Bilirubin Urine   (*)    Value: TEST NOT REPORTED DUE TO COLOR INTERFERENCE OF URINE PIGMENT   Ketones, ur   (*)    Value: TEST NOT REPORTED DUE TO COLOR INTERFERENCE OF URINE  PIGMENT   Protein, ur   (*)    Value: TEST NOT REPORTED DUE TO COLOR INTERFERENCE OF URINE PIGMENT   Nitrite   (*)    Value: TEST NOT REPORTED DUE TO COLOR INTERFERENCE OF URINE PIGMENT   Leukocytes,Ua   (*)    Value: TEST NOT REPORTED DUE TO COLOR INTERFERENCE OF URINE PIGMENT   All other components within normal limits  URINALYSIS, MICROSCOPIC (REFLEX) - Abnormal; Notable for the following components:   Bacteria, UA RARE (*)    All other components within normal limits  URINE CULTURE    EKG: None  Radiology: No results found.   Procedures   Medications Ordered in the ED - No data to display  ED Course  Patient seen and examined. History obtained directly from patient.   Labs/EKG: Ordered UA  Imaging: None ordered  Medications/Fluids: None ordered  Most recent vital signs  reviewed and are as follows: BP (!) 151/87 (BP Location: Right Arm)   Pulse 98   Temp 98.6 F (37 C) (Oral)   Resp 16   Ht 5' 3 (1.6 m)   Wt 68 kg   SpO2 98%   BMI 26.57 kg/m   Initial impression: Irritative UTI with hematuria, likely cystitis.  No other symptoms to suggest pyelonephritis.  4:28 PM Reassessment performed. Patient appears stable.  Labs personally reviewed and interpreted including: Dipstick unable to be interpreted due to red color; microscopy with blood, 6-10 white cells.  Reviewed pertinent lab work and imaging with patient at bedside. Questions answered.   Most current vital signs reviewed and are as follows: BP (!) 151/87 (BP Location: Right Arm)   Pulse 98   Temp 98.6 F (37 C) (Oral)   Resp 16   Ht 5' 3 (1.6 m)   Wt 68 kg   SpO2 98%   BMI 26.57 kg/m   Plan: Discharge to home.   Prescriptions written for: Bactrim, first dose given here in the emergency department.  Urine culture sent.  Other home care instructions discussed: Use of Pyridium as needed  ED return instructions discussed: Return with worsening abdominal pain, fever, severe flank pain.  Follow-up instructions discussed: Patient encouraged to follow-up with their PCP in 3-5 days if not improving or if she continues to see blood in the urine for recheck.                                  Medical Decision Making Amount and/or Complexity of Data Reviewed Labs: ordered.  Risk Prescription drug management.   Patient with acute onset of irritative UTI symptoms and hematuria.  UA is a bit difficult to interpret due to blood in the urine.  Urine culture sent as backup.  Will start on Bactrim.  No recent renal function testing so we will avoid nitrofurantoin .  Do not suspect pyelonephritis.  Patient is otherwise comfortable without any focal abdominal pain.  We discussed signs and symptoms to return.  If she were to have persistent blood in the urine, she is aware that she would need PCP  follow-up and further investigation.     Final diagnoses:  Acute cystitis with hematuria    ED Discharge Orders          Ordered    sulfamethoxazole-trimethoprim (BACTRIM DS) 800-160 MG tablet  2 times daily        08/29/24 1612    phenazopyridine (PYRIDIUM) 200 MG tablet  3 times daily        08/29/24 1612               Desiderio Chew, [place] 08/29/24 1630    Cottie Donnice PARAS, MD 08/29/24 JUDITHANN

## 2024-08-29 NOTE — Discharge Instructions (Signed)
 Please read and follow all provided instructions.  Your diagnoses today include:  1. Acute cystitis with hematuria     Tests performed today include: Urine test - suggests that you have an infection in your bladder Urine culture -- to ensure antibiotics should be affective for your infection Vital signs. See below for your results today.   Medications prescribed:  Bactrim (trimethoprim/sulfamethoxazole) - antibiotic  You have been prescribed an antibiotic medicine: take the entire course of medicine even if you are feeling better. Stopping early can cause the antibiotic not to work.  Pyridium - medication for urinary tract infection symptoms.   This medication will turn your urine orange. This is normal.   Home care instructions:  Follow any educational materials contained in this packet.  Follow-up instructions: Please follow-up with your primary care provider in 3-5 days if symptoms are not resolved for further evaluation of your symptoms.  Return instructions:  Please return to the Emergency Department if you experience worsening symptoms.  Return with fever, worsening pain, persistent vomiting, worsening pain in your back.  Please return if you have any other emergent concerns.  Additional Information:  Your vital signs today were: BP (!) 151/87 (BP Location: Right Arm)   Pulse 98   Temp 98.6 F (37 C) (Oral)   Resp 16   Ht 5' 3 (1.6 m)   Wt 68 kg   SpO2 98%   BMI 26.57 kg/m  If your blood pressure (BP) was elevated above 135/85 this visit, please have this repeated by your doctor within one month. --------------

## 2024-08-30 LAB — URINE CULTURE: Culture: <10000 — AB

## 2024-09-23 ENCOUNTER — Emergency Department (HOSPITAL_BASED_OUTPATIENT_CLINIC_OR_DEPARTMENT_OTHER)

## 2024-09-23 ENCOUNTER — Other Ambulatory Visit: Payer: Self-pay

## 2024-09-23 ENCOUNTER — Emergency Department (HOSPITAL_BASED_OUTPATIENT_CLINIC_OR_DEPARTMENT_OTHER)
Admission: EM | Admit: 2024-09-23 | Discharge: 2024-09-23 | Disposition: A | Discharge status: Home / Self Care | Attending: Emergency Medicine | Admitting: Emergency Medicine

## 2024-09-23 ENCOUNTER — Encounter (HOSPITAL_BASED_OUTPATIENT_CLINIC_OR_DEPARTMENT_OTHER): Payer: Self-pay

## 2024-09-23 DIAGNOSIS — Z79899 Other long term (current) drug therapy: Secondary | ICD-10-CM | POA: Diagnosis not present

## 2024-09-23 DIAGNOSIS — M25512 Pain in left shoulder: Secondary | ICD-10-CM | POA: Insufficient documentation

## 2024-09-23 DIAGNOSIS — I10 Essential (primary) hypertension: Secondary | ICD-10-CM | POA: Insufficient documentation

## 2024-09-23 LAB — BASIC METABOLIC PANEL WITH GFR
Anion gap: 12 (ref 5–15)
BUN: 29 mg/dL — ABNORMAL HIGH (ref 8–23)
CO2: 23 mmol/L (ref 22–32)
Calcium: 9.5 mg/dL (ref 8.9–10.3)
Chloride: 105 mmol/L (ref 98–111)
Creatinine, Ser: 1.05 mg/dL — ABNORMAL HIGH (ref 0.44–1.00)
GFR, Estimated: 57 mL/min — ABNORMAL LOW
Glucose, Bld: 134 mg/dL — ABNORMAL HIGH (ref 70–99)
Potassium: 3.5 mmol/L (ref 3.5–5.1)
Sodium: 140 mmol/L (ref 135–145)

## 2024-09-23 LAB — CBC
HCT: 37 % (ref 36.0–46.0)
Hemoglobin: 12 g/dL (ref 12.0–15.0)
MCH: 26 pg (ref 26.0–34.0)
MCHC: 32.4 g/dL (ref 30.0–36.0)
MCV: 80.3 fL (ref 80.0–100.0)
Platelets: 300 K/uL (ref 150–400)
RBC: 4.61 MIL/uL (ref 3.87–5.11)
RDW: 14 % (ref 11.5–15.5)
WBC: 5.9 K/uL (ref 4.0–10.5)
nRBC: 0 % (ref 0.0–0.2)

## 2024-09-23 LAB — TROPONIN T, HIGH SENSITIVITY: Troponin T High Sensitivity: <15 ng/L (ref 0–19)

## 2024-09-23 MED ORDER — METHYLPREDNISOLONE 4 MG PO TBPK: ORAL_TABLET | ORAL | 0 refills | Status: AC

## 2024-09-23 NOTE — Discharge Instructions (Addendum)
 Take steroids as prescribed for pain.  Continue 1000 mg of Tylenol  every 6 hours as needed for pain.  Use sling for comfort.  Follow-up with orthopedics.  I do think that this is from inflammation in your shoulder.  Orthopedics can chronically manage this.

## 2024-09-23 NOTE — ED Triage Notes (Signed)
 L shoulder pain for 2 months. No known injury. Denies chest pain or SHOB  No obvious deformity noted

## 2024-09-23 NOTE — ED Provider Notes (Signed)
 " Heather Woods Provider Note   CSN: [id number] Arrival date & time: 09/23/24  9073     Patient presents with: Shoulder Pain   Heather Woods is a 73 y.o. female.   Patient is here with left shoulder pain on and off for last couple months.  Worse today.  Seems worse with movement.  Denies any weakness numbness tingling.  Denies any specific injury.  Denies any chest pain or shortness of breath currently.  Has history of hypertension high cholesterol panic attack.  Denies any recent surgery or travel.  The history is provided by the patient.       Prior to Admission medications  Medication Sig Start Date End Date Taking? Authorizing Provider  meclizine (ANTIVERT) 12.5 MG tablet Take 12.5 mg by mouth 2 (two) times daily. 09/16/24  Yes [provider]  methylPREDNISolone  (MEDROL  DOSEPAK) 4 MG TBPK tablet Follow package insert 09/23/24  Yes Falana Clagg, DO  amLODipine  (NORVASC ) 10 MG tablet Take 1 tablet (10 mg total) by mouth daily. 08/15/22   Molpus, John, MD  chlorthalidone  (HYGROTON ) 25 MG tablet Take 0.5 tablets (12.5 mg total) by mouth daily. 08/15/22 11/13/22  Revankar, Rajan R, MD  fluticasone (FLONASE) 50 MCG/ACT nasal spray Place 2 sprays into both nostrils as needed for rhinitis. 10/05/20   [provider]  losartan  (COZAAR ) 100 MG tablet Take 1 tablet (100 mg total) by mouth daily. 08/12/22   Blue, Soijett A, PA-C  montelukast (SINGULAIR) 10 MG tablet Take 10 mg by mouth at bedtime. 07/25/22   [provider]  oxybutynin (DITROPAN) 5 MG tablet Take 5 mg by mouth 2 (two) times daily. 08/15/22   [provider]  phenazopyridine  (PYRIDIUM ) 200 MG tablet Take 1 tablet (200 mg total) by mouth 3 (three) times daily. 08/29/24   Desiderio Chew, PA-C  potassium chloride  (MICRO-K ) 10 MEQ CR capsule Take 10 mEq by mouth daily.    [provider]  rosuvastatin (CRESTOR) 20 MG tablet Take 20 mg by  mouth daily. 07/05/22   [provider]  sertraline (ZOLOFT) 100 MG tablet Take 100 mg by mouth daily.     [provider]  Vitamin D, Ergocalciferol, (DRISDOL) 1.25 MG (50000 UNIT) CAPS capsule Take 50,000 Units by mouth once a week. 07/05/22   [provider]    Allergies: Lisinopril and Penicillins    Review of Systems  Updated Vital Signs BP 130/67   Pulse 64   Temp 98.1 F (36.7 C)   Resp 17   SpO2 99%   Physical Exam Vitals and nursing note reviewed.  Constitutional:      General: She is not in acute distress.    Appearance: She is well-developed. She is not ill-appearing.  HENT:     Head: Normocephalic and atraumatic.     Nose: Nose normal.     Mouth/Throat:     Mouth: Mucous membranes are moist.  Eyes:     Extraocular Movements: Extraocular movements intact.     Conjunctiva/sclera: Conjunctivae normal.     Pupils: Pupils are equal, round, and reactive to light.  Cardiovascular:     Rate and Rhythm: Normal rate and regular rhythm.     Pulses: Normal pulses.     Heart sounds: Normal heart sounds. No murmur heard. Pulmonary:     Effort: Pulmonary effort is normal. No respiratory distress.     Breath sounds: Normal breath sounds.  Abdominal:     Palpations: Abdomen is  soft.     Tenderness: There is no abdominal tenderness.  Musculoskeletal:        General: Tenderness present. No swelling.     Cervical back: Neck supple.     Comments: Tenderness to the left shoulder  Skin:    General: Skin is warm and dry.     Capillary Refill: Capillary refill takes less than 2 seconds.  Neurological:     General: No focal deficit present.     Mental Status: She is alert.  Psychiatric:        Mood and Affect: Mood normal.     (all labs ordered are listed, but only abnormal results are displayed) Labs Reviewed  BASIC METABOLIC PANEL WITH GFR - Abnormal; Notable for the following components:      Result Value   Glucose, Bld 134 (*)    BUN 29  (*)    Creatinine, Ser 1.05 (*)    GFR, Estimated 57 (*)    All other components within normal limits  CBC  TROPONIN T, HIGH SENSITIVITY    EKG: EKG Interpretation Date/Time:  Monday September 23 2024 10:06:37 EST Ventricular Rate:  74 PR Interval:  193 QRS Duration:  142 QT Interval:  439 QTC Calculation: 488 R Axis:   7  Text Interpretation: Sinus rhythm Right bundle branch block Confirmed by Ruthe Cornet [phone]) on 09/23/2024 10:09:59 AM  Radiology: [facility] Shoulder Left Result Date: 09/23/2024 EXAM: 1 VIEW(S) XRAY OF THE LEFT SHOULDER 09/23/2024 10:45:00 AM COMPARISON: None available. CLINICAL HISTORY: shoulder pain FINDINGS: BONES AND JOINTS: The glenohumeral joint is maintained. No fracture, subluxation, or dislocation. Degenerative changes in the left [facility] joint with joint space narrowing and spurring. SOFT TISSUES: Calcifications at the rotator cuff insertion compatible with chronic tendinitis. Visualized lung is unremarkable. IMPRESSION: 1. Calcifications at the rotator cuff insertion compatible with chronic tendinitis. 2. Left AC joint DJD. 3. No acute findings. Electronically signed by: Franky Crease MD 09/23/2024 12:37 PM EST RP Workstation: [id number]   DG Chest 2 View Result Date: 09/23/2024 EXAM: 2 VIEW(S) XRAY OF THE CHEST 09/23/2024 10:45:00 AM COMPARISON: 09/23/2021. CLINICAL HISTORY: shoulder pain FINDINGS: LUNGS AND PLEURA: No focal pulmonary opacity. No pleural effusion. No pneumothorax. HEART AND MEDIASTINUM: No acute abnormality of the cardiac and mediastinal silhouettes. BONES AND SOFT TISSUES: No acute osseous abnormality. IMPRESSION: 1. No acute process. Electronically signed by: Franky Crease MD 09/23/2024 12:36 PM EST RP Workstation: [id number]     Procedures   Medications Ordered in the ED - No data to display                                  Medical Decision Making Amount and/or Complexity of Data Reviewed Labs: ordered. Radiology:  ordered.  Risk Prescription drug management.   Heather Woods is here with left shoulder pain on and off for the last couple months.  Does not really seem to be associated with the chest.  She not have any shortness of breath.  She has no PE risk factors.  She has high cholesterol hypertension bronchitis history.  Heart score is 2.  Troponin normal.  EKG shows sinus rhythm.  No ischemic changes.  I doubt any cardiac process.  There is no sign of pneumonia on x-ray.  There is no significant leukocytosis anemia or electrolyte abnormality otherwise.  Stray of the left shoulder does show calcifications of the rotator cuff compatible with chronic tendinitis and overall I  suspect inflammatory process in the shoulder causing her pain.  Will put her on Medrol  Dosepak sling for comfort recommend continued use of Tylenol  ice and follow-up with orthopedics.  Discharged in good condition.  She is neurovascular neuromuscular intact on exam.  I have no concern for blood clot or any arterial issue.  Discharge.  This chart was dictated using voice recognition software.  Despite best efforts to proofread,  errors can occur which can change the documentation meaning.      Final diagnoses:  Acute pain of left shoulder    ED Discharge Orders          Ordered    methylPREDNISolone  (MEDROL  DOSEPAK) 4 MG TBPK tablet        09/23/24 1241               Ruthe Cornet, DO 09/23/24 1243  "

## 2024-10-14 ENCOUNTER — Ambulatory Visit: Admitting: Podiatry

## 2024-10-14 DIAGNOSIS — M21611 Bunion of right foot: Secondary | ICD-10-CM | POA: Diagnosis not present

## 2024-10-14 DIAGNOSIS — M21612 Bunion of left foot: Secondary | ICD-10-CM

## 2024-10-14 DIAGNOSIS — L84 Corns and callosities: Secondary | ICD-10-CM | POA: Diagnosis not present

## 2024-10-14 DIAGNOSIS — M205X1 Other deformities of toe(s) (acquired), right foot: Secondary | ICD-10-CM

## 2024-10-14 NOTE — Progress Notes (Signed)
 "   Chief Complaint  Patient presents with   Callouses    Right foot, 5th toe, lateral side corn. Very painful. Bunions bilaterally, she is ok with making an apt for the bunion xrays, the corn is more painful today.  States she is not diabetic, no anti coag.    HPI: 74 y.o. female presents today with concern of a painful corn to the right fifth toe.  She states that this is a recurring issue for her.  She acknowledges that the corn is small but states that it can become quite painful sometimes.  She was aggravated.  She also had secondary concern of bilateral bunions.  She would like to discuss options for fixing these.  She states that all close toed shoes aggravate it.  She also does not like how it protrudes out when she did wear sandals in the summertime.  She feels that the right might be slightly worse than the left foot.  Past Medical History:  Diagnosis Date   Adult BMI 29.0-29.9 kg/sq m    Allergic bronchitis    Allergic rhinitis 09/08/2017   Allergic rhinosinusitis    Anxiety    Anxiety and depression    At risk for falls    Bronchitis    Depression    Diarrhea    Diet-controlled diabetes mellitus (HCC) 12/11/2020   DM (diabetes mellitus), type 2 (HCC)    Dyslipidemia 09/08/2017   Dyspnea on exertion 09/08/2017   Dysuria    Essential hypertension 09/08/2017   Eustachian catarrh, bilateral    Excess ear wax    Excessive ear wax, bilateral    Grief    High cholesterol    History of chest pain    Hypercholesteremia    Hyperglycemia    Hyperlipidemia    Hypertension    Irregular bowel habits    Menopause    Mixed dyslipidemia 09/08/2017   Mood disorder    Obesity    Other specified disorders of bone density and structure, other site    Overactive bladder    Overweight    Palpitations 09/08/2017   Panic attack    Shoulder arthritis    Skin lesion of face    Tendinitis, de Quervain's    Uncontrolled hypertension 08/15/2022   Urinary tract infection    Vitamin D  deficiency    Vitamin D deficiency    Past Surgical History:  Procedure Laterality Date   BREAST BIOPSY  2006   Pathology normal   TONSILLECTOMY  1999   TUBAL LIGATION  1983   Allergies[1] Review of Systems  Musculoskeletal:  Positive for joint pain.      Physical Exam: Palpable pedal pulses.  No open lesions are noted.  There is adducted and varus position of bilateral fifth toes.  There is a small focal hyperkeratotic lesion on the dorsal lateral aspect of the bilateral fifth toe PIPJ's.  No surrounding erythema or ulceration is noted.  No interdigital corns are noted.  There is a bony palpable prominence on the medial aspect of the first metatarsal head bilateral.  There is lateral angulation of the great toe bilateral which is manually reducible with manipulation.  This is consistent with hallux valgus with bunion deformity bilateral.  Epicritic sensation intact.  Assessment/Plan of Care: 1. Corns   2. Bunion, left foot   3. Bunion, right foot   4. Adductovarus rotation of toe, acquired, right     The hyperkeratotic lesions on the bilateral fifth toe PIPJ's were shaved  with a sterile #313 blade.  Salinocaine was applied to the right fifth toe corn area post debridement followed by a Band-Aid.  She can remove this later today.  This is an attempt to try to prevent recurrence.  However she was warned that it can definitely recur if there is pressure to the area.  Patient was fitted for toe alignment splints for the bilateral bunions with hallux valgus to start wearing.  She can wear these at bedtime to help splint the toe in a rectus position.  The medical assistant reviewed how to wear these at bedtime.  She will continue with this over the next few weeks.  Will reschedule the patient for bilateral foot x-rays to evaluate the bunion and hallux valgus deformity to discuss surgical options.  She may want to have the right foot corrected before the left.  At this time we will just discuss  options and then I will leave it up to the patient as to when she would like to proceed with any surgical intervention.   Awanda CHARM Imperial, DPM, FACFAS [facility]     [address] [place], [place] [address]                Office [phone]  Fax [phone]    [1]  Allergies Allergen Reactions   Lisinopril Other (See Comments)    Lip tingling    Penicillins     Tachycardia    "

## 2024-10-16 ENCOUNTER — Other Ambulatory Visit: Payer: Self-pay

## 2024-10-16 ENCOUNTER — Emergency Department (HOSPITAL_BASED_OUTPATIENT_CLINIC_OR_DEPARTMENT_OTHER)
Admission: EM | Admit: 2024-10-16 | Discharge: 2024-10-16 | Disposition: A | Discharge status: Home / Self Care | Attending: Emergency Medicine | Admitting: Emergency Medicine

## 2024-10-16 ENCOUNTER — Encounter (HOSPITAL_BASED_OUTPATIENT_CLINIC_OR_DEPARTMENT_OTHER): Payer: Self-pay

## 2024-10-16 DIAGNOSIS — E1165 Type 2 diabetes mellitus with hyperglycemia: Secondary | ICD-10-CM | POA: Diagnosis not present

## 2024-10-16 DIAGNOSIS — R300 Dysuria: Secondary | ICD-10-CM | POA: Diagnosis present

## 2024-10-16 DIAGNOSIS — Z79899 Other long term (current) drug therapy: Secondary | ICD-10-CM | POA: Diagnosis not present

## 2024-10-16 DIAGNOSIS — I10 Essential (primary) hypertension: Secondary | ICD-10-CM | POA: Diagnosis not present

## 2024-10-16 DIAGNOSIS — N3000 Acute cystitis without hematuria: Secondary | ICD-10-CM | POA: Diagnosis not present

## 2024-10-16 LAB — URINALYSIS, ROUTINE W REFLEX MICROSCOPIC
Bilirubin Urine: NEGATIVE
Glucose, UA: NEGATIVE mg/dL
Ketones, ur: NEGATIVE mg/dL
Nitrite: NEGATIVE
Protein, ur: NEGATIVE mg/dL
Specific Gravity, Urine: 1.01 (ref 1.005–1.030)
pH: 5.5 (ref 5.0–8.0)

## 2024-10-16 LAB — URINALYSIS, MICROSCOPIC (REFLEX)

## 2024-10-16 MED ORDER — CEPHALEXIN 250 MG PO CAPS
500.0000 mg | ORAL_CAPSULE | Freq: Once | ORAL | Status: AC
  Administered 2024-10-16: 500 mg via ORAL
  Filled 2024-10-16: qty 2

## 2024-10-16 MED ORDER — CEPHALEXIN 500 MG PO CAPS: 500.0000 mg | ORAL_CAPSULE | Freq: Two times a day (BID) | ORAL | 0 refills | Status: AC

## 2024-10-16 NOTE — ED Provider Notes (Signed)
 " [place] [facility] Provider Note   CSN: [id number] Arrival date & time: 10/16/24  2001     Patient presents with: Dysuria   Heather Woods is a 74 y.o. female.   Patient is a 74 year old who presents with possible UTI.  She has been having some burning on urination for the last couple of days.  She has a little bit of pressure in her lower abdomen.  She has had some nausea but no current nausea.  No vomiting.  No fevers.  She went to her PCP and was prescribed Cipro based on her urinalysis results.  She said her urine was sent for culture but she does not have these results.  She said she took a dose of the Cipro and she developed a panic attack.  She called her doctor's office and was wanting them to prescribe something different but has not heard back.  She is also having some leakage in her underwear and does not know if she is having some vaginal infection.       Prior to Admission medications  Medication Sig Start Date End Date Taking? Authorizing Provider  cephALEXin  (KEFLEX ) 500 MG capsule Take 1 capsule (500 mg total) by mouth 2 (two) times daily. 10/16/24  Yes Lenor Hollering, MD  amLODipine  (NORVASC ) 10 MG tablet Take 1 tablet (10 mg total) by mouth daily. 08/15/22   Molpus, John, MD  chlorthalidone  (HYGROTON ) 25 MG tablet Take 0.5 tablets (12.5 mg total) by mouth daily. 08/15/22 10/14/24  Revankar, Rajan R, MD  fluticasone (FLONASE) 50 MCG/ACT nasal spray Place 2 sprays into both nostrils as needed for rhinitis. 10/05/20   [provider]  losartan  (COZAAR ) 100 MG tablet Take 1 tablet (100 mg total) by mouth daily. 08/12/22   Blue, Soijett A, PA-C  meclizine (ANTIVERT) 12.5 MG tablet Take 12.5 mg by mouth 2 (two) times daily. 09/16/24   [provider]  methylPREDNISolone  (MEDROL  DOSEPAK) 4 MG TBPK tablet Follow package insert 09/23/24   Curatolo, Adam, DO  montelukast (SINGULAIR) 10 MG tablet Take 10 mg by mouth at  bedtime. 07/25/22   [provider]  oxybutynin (DITROPAN) 5 MG tablet Take 5 mg by mouth 2 (two) times daily. 08/15/22   [provider]  phenazopyridine  (PYRIDIUM ) 200 MG tablet Take 1 tablet (200 mg total) by mouth 3 (three) times daily. 08/29/24   Desiderio Chew, PA-C  potassium chloride  (MICRO-K ) 10 MEQ CR capsule Take 10 mEq by mouth daily.    [provider]  rosuvastatin (CRESTOR) 20 MG tablet Take 20 mg by mouth daily. 07/05/22   [provider]  sertraline (ZOLOFT) 100 MG tablet Take 100 mg by mouth daily.     [provider]  Vitamin D, Ergocalciferol, (DRISDOL) 1.25 MG (50000 UNIT) CAPS capsule Take 50,000 Units by mouth once a week. 07/05/22   [provider]    Allergies: Lisinopril and Penicillins    Review of Systems  Constitutional:  Negative for chills, diaphoresis, fatigue and fever.  HENT:  Negative for congestion, rhinorrhea and sneezing.   Eyes: Negative.   Respiratory:  Negative for cough, chest tightness and shortness of breath.   Cardiovascular:  Negative for chest pain.  Gastrointestinal:  Positive for abdominal pain and nausea. Negative for diarrhea and vomiting.  Genitourinary:  Positive for dysuria. Negative for difficulty urinating, flank pain and frequency.  Musculoskeletal:  Negative for arthralgias and back pain.  Skin:  Negative for rash.  Neurological:  Negative for dizziness, speech difficulty, weakness, numbness and headaches.    Updated Vital Signs BP 120/79   Pulse 70   Temp 97.9 F (36.6 C) (Oral)   Resp 18   SpO2 98%   Physical Exam Constitutional:      Appearance: She is well-developed.  HENT:     Head: Normocephalic and atraumatic.  Eyes:     Pupils: Pupils are equal, round, and reactive to light.  Cardiovascular:     Rate and Rhythm: Normal rate and regular rhythm.     Heart sounds: Normal heart sounds.  Pulmonary:     Effort: Pulmonary effort is normal. No respiratory distress.      Breath sounds: Normal breath sounds. No wheezing or rales.  Chest:     Chest wall: No tenderness.  Abdominal:     General: Bowel sounds are normal.     Palpations: Abdomen is soft.     Tenderness: There is no abdominal tenderness. There is no guarding or rebound.  Musculoskeletal:        General: Normal range of motion.     Cervical back: Normal range of motion and neck supple.  Lymphadenopathy:     Cervical: No cervical adenopathy.  Skin:    General: Skin is warm and dry.     Findings: No rash.  Neurological:     Mental Status: She is alert and oriented to person, place, and time.     (all labs ordered are listed, but only abnormal results are displayed) Labs Reviewed  URINALYSIS, ROUTINE W REFLEX MICROSCOPIC - Abnormal; Notable for the following components:      Result Value   APPearance HAZY (*)    Hgb urine dipstick SMALL (*)    Leukocytes,Ua MODERATE (*)    All other components within normal limits  URINALYSIS, MICROSCOPIC (REFLEX) - Abnormal; Notable for the following components:   Bacteria, UA MANY (*)    All other components within normal limits    EKG: None  Radiology: No results found.   Procedures   Medications Ordered in the ED  cephALEXin  (KEFLEX ) capsule 500 mg (has no administration in time range)                                    Medical Decision Making Amount and/or Complexity of Data Reviewed Labs: ordered.  Risk Prescription drug management.   This patient presents to the ED for concern of dysuria, this involves an extensive number of treatment options, and is a complaint that carries with it a high risk of complications and morbidity.  I considered the following differential and admission for this acute, potentially life threatening condition.  The differential diagnosis includes UTI, pyelonephritis, kidney stone, vaginal infection/fistula  MDM:    Patient is 74 year old who presents with dysuria.  She questions whether she is  having some vaginal discharge but does not know if it is just some urinary leakage.  Pelvic exam was performed.  I do not see any concerns for vaginal infection.  I do not see any signs of drainage that would be more concerning for a fistula.  Her urine does show some signs of infection.  She said it is already been sent for a culture by her PCP.  Will switch her from her medication Cipro which she was not tolerating to Keflex .  She does not appear systemically ill.  She does not have any abdominal tenderness on  exam.  No other symptoms that would be more concerning for kidney stone.  She was discharged home in good condition.  She was encouraged to follow-up with her PCP.  Return precautions were given.  (Labs, imaging, consults)  Labs: I Ordered, and personally interpreted labs.  The pertinent results include: Urine concerning for infection  Imaging Studies ordered: I ordered imaging studies including   I independently visualized and interpreted imaging. I agree with the radiologist interpretation  Additional history obtained from  .  External records from outside source obtained and reviewed including prior notes  Cardiac Monitoring: The patient was not maintained on a cardiac monitor.  If on the cardiac monitor, I personally viewed and interpreted the cardiac monitored which showed an underlying rhythm of:    Reevaluation: After the interventions noted above, I reevaluated the patient and found that they have :improved  Social Determinants of Health:    Disposition: Discharged to home  Co morbidities that complicate the patient evaluation  Past Medical History:  Diagnosis Date   Adult BMI 29.0-29.9 kg/sq m    Allergic bronchitis    Allergic rhinitis 09/08/2017   Allergic rhinosinusitis    Anxiety    Anxiety and depression    At risk for falls    Bronchitis    Depression    Diarrhea    Diet-controlled diabetes mellitus (HCC) 12/11/2020   DM (diabetes mellitus), type 2 (HCC)     Dyslipidemia 09/08/2017   Dyspnea on exertion 09/08/2017   Dysuria    Essential hypertension 09/08/2017   Eustachian catarrh, bilateral    Excess ear wax    Excessive ear wax, bilateral    Grief    High cholesterol    History of chest pain    Hypercholesteremia    Hyperglycemia    Hyperlipidemia    Hypertension    Irregular bowel habits    Menopause    Mixed dyslipidemia 09/08/2017   Mood disorder    Obesity    Other specified disorders of bone density and structure, other site    Overactive bladder    Overweight    Palpitations 09/08/2017   Panic attack    Shoulder arthritis    Skin lesion of face    Tendinitis, de Quervain's    Uncontrolled hypertension 08/15/2022   Urinary tract infection    Vitamin D deficiency    Vitamin D deficiency      Medicines Meds ordered this encounter  Medications   cephALEXin  (KEFLEX ) capsule 500 mg   cephALEXin  (KEFLEX ) 500 MG capsule    Sig: Take 1 capsule (500 mg total) by mouth 2 (two) times daily.    Dispense:  14 capsule    Refill:  0    I have reviewed the patients home medicines and have made adjustments as needed  Problem List / ED Course: Problem List Items Addressed This Visit       Genitourinary   Urinary tract infection - Primary   Relevant Medications   cephALEXin  (KEFLEX ) capsule 500 mg (Start on 10/16/2024 10:30 PM)   cephALEXin  (KEFLEX ) 500 MG capsule             Final diagnoses:  Acute cystitis without hematuria    ED Discharge Orders          Ordered    cephALEXin  (KEFLEX ) 500 MG capsule  2 times daily        10/16/24 2224  Lenor Hollering, MD 10/16/24 2227  "

## 2024-10-16 NOTE — Discharge Instructions (Addendum)
 Stop taking your ciprofloxacin and start taking the new antibiotic.  Make an appointment to follow-up with your primary care doctor to make sure your symptoms are improving.  Return to the emergency room if you have any worsening symptoms.

## 2024-10-16 NOTE — ED Triage Notes (Signed)
 Pt. Reports not sure if uti or female infection. Went to dr. Lawerance Monday given cipro took 1 dose and couldn't sleep, had panic attack and nausea. Tuesday called pcp to change meds and haven't heard anything. Pt. States burning with urination started this weekend. Vaginal pressure started this morning

## 2024-11-04 ENCOUNTER — Ambulatory Visit: Admitting: Podiatry
